# Patient Record
Sex: Female | Born: 2000 | Race: Black or African American | Hispanic: No | Smoking: Never smoker
Health system: Southern US, Community
[De-identification: ages and names within clinical notes are randomized; demographics above are authoritative.]

## PROBLEM LIST (undated history)

## (undated) HISTORY — PX: NO PAST SURGERIES: SHX2092

---
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
## 2019-01-22 ENCOUNTER — Other Ambulatory Visit: Payer: Self-pay

## 2019-01-22 ENCOUNTER — Ambulatory Visit (INDEPENDENT_AMBULATORY_CARE_PROVIDER_SITE_OTHER): Payer: Medicaid Other | Admitting: Certified Nurse Midwife

## 2019-01-22 ENCOUNTER — Ambulatory Visit (INDEPENDENT_AMBULATORY_CARE_PROVIDER_SITE_OTHER): Payer: Medicaid Other

## 2019-01-22 VITALS — BP 100/60 | Temp 96.6°F | Wt 145.0 lb

## 2019-01-22 DIAGNOSIS — Z131 Encounter for screening for diabetes mellitus: Secondary | ICD-10-CM

## 2019-01-22 DIAGNOSIS — Z3403 Encounter for supervision of normal first pregnancy, third trimester: Secondary | ICD-10-CM

## 2019-01-22 DIAGNOSIS — Z362 Encounter for other antenatal screening follow-up: Secondary | ICD-10-CM | POA: Diagnosis not present

## 2019-01-22 DIAGNOSIS — Z3A28 28 weeks gestation of pregnancy: Secondary | ICD-10-CM

## 2019-01-22 DIAGNOSIS — Z130 Encounter for screening for diseases of the blood and blood-forming organs and certain disorders involving the immune mechanism: Secondary | ICD-10-CM

## 2019-01-22 DIAGNOSIS — Z3400 Encounter for supervision of normal first pregnancy, unspecified trimester: Secondary | ICD-10-CM

## 2019-01-22 DIAGNOSIS — A530 Latent syphilis, unspecified as early or late: Secondary | ICD-10-CM

## 2019-01-22 LAB — POCT URINALYSIS DIPSTICK OB: Glucose, UA: NEGATIVE

## 2019-01-22 NOTE — Progress Notes (Signed)
C/o no complaints.  Pls see sticky note regarding tx for syphilis - pt hasn't been seen in 41m.rj

## 2019-01-27 NOTE — Progress Notes (Signed)
ROB at [facility]: Has not been seen in 2 months. Has had 3 Bicillin injections for latent syphilis (LD 12/14/18) at [facility]. Has not been scheduled for 28 week labs. Baby active. Denies problems with contractions, bleeding, or leakage of fluid. Weight gain of 17# in the last 10 weeks, but TWG of 13# FH c/w dates; FHTs WNL ROB and 28 week labs in 1-2 weeks Dalia Heading, CNM

## 2019-02-02 NOTE — L&D Delivery Note (Signed)
  PREOPERATIVE DIAGNOSES: 1. Term pregnancy at 38wks and 6days Gestational Age 20.  Non-Reassuring Fetal Status- Repetitive decelerations  POSTOPERATIVE DIAGNOSES: 1. Term pregnancy at 38 wks and 6days Gestational Age 20. Live, Viable female infant 3. Non-Reassuring Fetal Status- Repetitive decelerations 4. Nuchal cord x 1  OPERATION PERFORMED: Vacuum-Assisted Vaginal Delivery  SURGEON: Adelene Idler, MD  ANESTHESIA: Epidural  ESTIMATED BLOOD LOSS:200cc  FINDINGS: Delivered a female weight pending with Apgars 8&9, three-vessel cord and normal placenta.   COMPLICATIONS: None  SITUATION:  The nonreassuring fetal heart rate tracing was discussed with the patient and her partner, as were the delivery options including a Kiwi vacuum delivery. The pros and cons and the risks of the vacuum delivery were discussed in detail, as were the alternative approaches. The patient and her partner decided to proceed with a Kiwi vacuum delivery.   DESCRIPTION OF THE PROCEDURE:   The baby's head was confirmed to be in the ROA presentation with 100% effacement and +2 station. The bladder was drained. The vacuum was placed and the correct placement in front of the posterior fontanelle was confirmed digitally. With the patient's next contraction, the vacuum was inflated and a gentle pressure was used to assist with maternal pushes to deliver the baby's head. In total there were 2 pulls and 1 pop-off. The vacuum was released between contractions.  After delivery of the head, the vacuum was deflated and removed. There was a nuchal cord. The anterior left shoulder was delivered with gentle downward guidance followed by delivery of the posterior right shoulder with gentle upward guidance. The infant was placed on the maternal chest.  The cord was clamped x2 and cut. The infant was attended to the NICU attendant, vigorous.   Pitocin was added to the patient's IV fluids. The placenta delivered spontaneously, was  intact and had a three-vessel cord. A vaginal inspection revealed bilateral periurethral lacerations. The lacerations were repaired with #2-0  Vicryl suture in a running fashion with local anesthesia.   At the end of the delivery mom and baby were recovering in stable condition.  Sponge, instrument, and needle counts were correct times two.   Adelene Idler MD [facility], [facility] 04/05/19 10:27 PM

## 2019-02-05 ENCOUNTER — Encounter: Payer: Medicaid Other | Admitting: Advanced Practice Midwife

## 2019-02-05 ENCOUNTER — Other Ambulatory Visit: Payer: Medicaid Other

## 2019-02-08 ENCOUNTER — Ambulatory Visit (INDEPENDENT_AMBULATORY_CARE_PROVIDER_SITE_OTHER): Payer: Medicaid Other | Admitting: Obstetrics and Gynecology

## 2019-02-08 ENCOUNTER — Other Ambulatory Visit: Payer: Medicaid Other

## 2019-02-08 ENCOUNTER — Other Ambulatory Visit: Payer: Self-pay

## 2019-02-08 VITALS — BP 100/70 | Wt 149.0 lb

## 2019-02-08 DIAGNOSIS — Z3403 Encounter for supervision of normal first pregnancy, third trimester: Secondary | ICD-10-CM

## 2019-02-08 DIAGNOSIS — A530 Latent syphilis, unspecified as early or late: Secondary | ICD-10-CM

## 2019-02-08 DIAGNOSIS — Z3400 Encounter for supervision of normal first pregnancy, unspecified trimester: Secondary | ICD-10-CM

## 2019-02-08 DIAGNOSIS — Z130 Encounter for screening for diseases of the blood and blood-forming organs and certain disorders involving the immune mechanism: Secondary | ICD-10-CM

## 2019-02-08 DIAGNOSIS — Z131 Encounter for screening for diabetes mellitus: Secondary | ICD-10-CM

## 2019-02-08 DIAGNOSIS — Z3A30 30 weeks gestation of pregnancy: Secondary | ICD-10-CM

## 2019-02-08 NOTE — Patient Instructions (Signed)
I value your feedback and entrusting us with your care. If you get a [facility] patient survey, I would appreciate you taking the time to let us know about your experience today. Thank you!  As of January 11, 2019, your lab results will be released to your MyChart immediately, before I even have a chance to see them. Please give me time to review them and contact you if there are any abnormalities. Thank you for your patience.  

## 2019-02-08 NOTE — Progress Notes (Signed)
ROB BT consent today/Tdap needs to be at [facility] C/o concerned about date discrepancy  Denies lof, no vb, Good FM

## 2019-02-08 NOTE — Progress Notes (Signed)
   Subjective  Fetal Movement? yes Contractions? no Leaking Fluid? no Vaginal Bleeding? no PNVs? Yes  28 wk labs today, completed bicillin tx for syphilis. Having heartburn, not relieved with tums. Try prevacid OTC (handout given). EDD discussed with pt and questions answered.   Objective  LMP 05/05/2018 (LMP Unknown)  General: NAD Pulmonary: no increased work of breathing Abdomen: gravid, non-tender Extremities: no edema Psychiatric: mood appropriate, affect full  Assessment  19 y.o. G1P0000 at [redacted]w[redacted]d by  04/13/2019, by Ultrasound presenting for routine prenatal visit  Plan   Problem List Items Addressed This Visit    None     Labs: 28 wk labs today  RTO: 2 weeks  Kristina Frost B. Kristina Stormes, PA-C [facility],  02/08/2019  9:51 AM

## 2019-02-10 LAB — 28 WEEK RH+PANEL
Basophils Absolute: 0 10*3/uL (ref 0.0–0.2)
Basos: 0 %
EOS (ABSOLUTE): 0 10*3/uL (ref 0.0–0.4)
Eos: 1 %
Gestational Diabetes Screen: 84 mg/dL (ref 65–139)
HIV Screen 4th Generation wRfx: NONREACTIVE
Hematocrit: 32.3 % — ABNORMAL LOW (ref 34.0–46.6)
Hemoglobin: 10.9 g/dL — ABNORMAL LOW (ref 11.1–15.9)
Immature Grans (Abs): 0 10*3/uL (ref 0.0–0.1)
Immature Granulocytes: 0 %
Lymphocytes Absolute: 1.7 10*3/uL (ref 0.7–3.1)
Lymphs: 26 %
MCH: 30.3 pg (ref 26.6–33.0)
MCHC: 33.7 g/dL (ref 31.5–35.7)
MCV: 90 fL (ref 79–97)
Monocytes Absolute: 0.5 10*3/uL (ref 0.1–0.9)
Monocytes: 8 %
Neutrophils Absolute: 4.4 10*3/uL (ref 1.4–7.0)
Neutrophils: 65 %
Platelets: 253 10*3/uL (ref 150–450)
RBC: 3.6 x10E6/uL — ABNORMAL LOW (ref 3.77–5.28)
RDW: 12.9 % (ref 11.7–15.4)
RPR Ser Ql: REACTIVE — AB
WBC: 6.7 10*3/uL (ref 3.4–10.8)

## 2019-02-10 LAB — RPR, QUANT+TP ABS (REFLEX)
Rapid Plasma Reagin, Quant: 1:8 {titer} — ABNORMAL HIGH
T Pallidum Abs: REACTIVE — AB

## 2019-02-22 ENCOUNTER — Ambulatory Visit (INDEPENDENT_AMBULATORY_CARE_PROVIDER_SITE_OTHER): Payer: Medicaid Other | Admitting: Obstetrics and Gynecology

## 2019-02-22 ENCOUNTER — Other Ambulatory Visit: Payer: Self-pay

## 2019-02-22 ENCOUNTER — Encounter: Payer: Self-pay | Admitting: Obstetrics and Gynecology

## 2019-02-22 VITALS — BP 114/70 | Wt 153.0 lb

## 2019-02-22 DIAGNOSIS — Z3A32 32 weeks gestation of pregnancy: Secondary | ICD-10-CM

## 2019-02-22 DIAGNOSIS — A528 Late syphilis, latent: Secondary | ICD-10-CM

## 2019-02-22 DIAGNOSIS — Z3403 Encounter for supervision of normal first pregnancy, third trimester: Secondary | ICD-10-CM

## 2019-02-22 DIAGNOSIS — Z3400 Encounter for supervision of normal first pregnancy, unspecified trimester: Secondary | ICD-10-CM

## 2019-02-22 NOTE — Progress Notes (Signed)
Routine Prenatal Care Visit  Subjective  Kristina Frost is a 19 y.o. G1P0000 at [redacted]w[redacted]d being seen today for ongoing prenatal care.  She is currently monitored for the following issues for this high-risk pregnancy and has Supervision of normal first pregnancy, antepartum and Syphilis, late latent on their problem list.  ----------------------------------------------------------------------------------- Patient reports no complaints.   Contractions: Not present. Vag. Bleeding: None.  Movement: Present. Leaking Fluid denies.  ----------------------------------------------------------------------------------- The following portions of the patient's history were reviewed and updated as appropriate: allergies, current medications, past family history, past medical history, past social history, past surgical history and problem list. Problem list updated.  Objective  Blood pressure 114/70, weight 153 lb (69.4 kg), last menstrual period 05/05/2018. Pregravid weight 132 lb (59.9 kg) Total Weight Gain 21 lb (9.526 kg) Urinalysis: Urine Protein    Urine Glucose    Fetal Status: Fetal Heart Rate (bpm): 145 Fundal Height: 31 cm Movement: Present     General:  Alert, oriented and cooperative. Patient is in no acute distress.  Skin: Skin is warm and dry. No rash noted.   Cardiovascular: Normal heart rate noted  Respiratory: Normal respiratory effort, no problems with respiration noted  Abdomen: Soft, gravid, appropriate for gestational age. Pain/Pressure: Absent     Pelvic:  Cervical exam deferred        Extremities: Normal range of motion.  Edema: None  Mental Status: Normal mood and affect. Normal behavior. Normal judgment and thought content.   Assessment   19 y.o. G1P0000 at [redacted]w[redacted]d by  04/13/2019, by Ultrasound presenting for routine prenatal visit  Plan   Pregnancy#1 Problems (from 05/05/18 to present)    Problem Noted Resolved   Syphilis, late latent 10/05/2018 by Landry Dyke, PA-C  No   Overview Addendum 12/04/2018  1:40 PM by Jossie Ng, RN    09/15/18 RPR 1:64, treponemal test pos @ [facility] Needs weekly tx x 3 [x]  bicillin 2.4 mU given 10/05/18 [x ] bicillin 2.4 mU given 10/12/18 [ ]  bicillin 2.4 mU--missed so needs to repeat series Repeat testing 6 mo (plan to be done at 36-wk prenatal visit at [facility]  Needs weekly tx x 3 [x ]bicillin 2.4 mU--given 11/27/18 [x ]bicillin 2.4 mU - given 12/04/2018 [ ] bicillin 2.4 mU       Supervision of normal first pregnancy, antepartum 08/25/2018 by 13/03/2018, CNM No   Overview Addendum 01/22/2019  3:40 PM by 08/27/2018, CNM    Clinic [facility] Prenatal Labs  Dating By 6w u/s at [facility] Blood type: O/Positive/-- (08/14 1455)   Genetic Screen 1 Screen:    AFP:     Quad:     NIPS: Antibody:Negative (08/14 1455)  Anatomic [facility]  Rubella: 6.06 (08/14 1455) Varicella: @VZVIGG @  GTT Early: NA Third trimester:  RPR: Reactive (08/14 1455)   Rhogam  HBsAg: Negative (08/14 1455)   TDaP vaccine  Flu Shot: HIV: Non Reactive (08/14 1455)   Baby Food Considering breast                    GBS:   Contraception  Pap: <21 yo  CBB     CS/VBAC NA   Support Person Boyfriend Starain              Preterm labor symptoms and general obstetric precautions including but not limited to vaginal bleeding, contractions, leaking of fluid and fetal movement were reviewed in detail with the patient. Please refer to After Visit Summary for other counseling recommendations.   Return  in about 2 weeks (around 03/08/2019) for Routine Prenatal Appointment.  Prentice Docker, MD, [facility], [facility] 02/22/2019 2:25 PM

## 2019-03-09 ENCOUNTER — Encounter: Payer: Medicaid Other | Admitting: Obstetrics and Gynecology

## 2019-03-13 ENCOUNTER — Encounter: Payer: Medicaid Other | Admitting: Obstetrics and Gynecology

## 2019-04-05 ENCOUNTER — Other Ambulatory Visit: Payer: Self-pay

## 2019-04-05 ENCOUNTER — Inpatient Hospital Stay: Payer: Medicaid Other | Admitting: Anesthesiology

## 2019-04-05 ENCOUNTER — Ambulatory Visit (INDEPENDENT_AMBULATORY_CARE_PROVIDER_SITE_OTHER): Payer: Medicaid Other | Admitting: Certified Nurse Midwife

## 2019-04-05 ENCOUNTER — Inpatient Hospital Stay
Admission: EM | Admit: 2019-04-05 | Discharge: 2019-04-07 | DRG: 806 | Disposition: A | Discharge status: Home / Self Care | Payer: Medicaid Other | Attending: Obstetrics and Gynecology | Admitting: Obstetrics and Gynecology

## 2019-04-05 ENCOUNTER — Encounter: Payer: Self-pay | Admitting: Obstetrics and Gynecology

## 2019-04-05 VITALS — BP 142/98 | Wt 164.0 lb

## 2019-04-05 DIAGNOSIS — Z3A38 38 weeks gestation of pregnancy: Secondary | ICD-10-CM

## 2019-04-05 DIAGNOSIS — O163 Unspecified maternal hypertension, third trimester: Secondary | ICD-10-CM | POA: Diagnosis present

## 2019-04-05 DIAGNOSIS — O139 Gestational [pregnancy-induced] hypertension without significant proteinuria, unspecified trimester: Secondary | ICD-10-CM

## 2019-04-05 DIAGNOSIS — Z20822 Contact with and (suspected) exposure to covid-19: Secondary | ICD-10-CM | POA: Diagnosis present

## 2019-04-05 DIAGNOSIS — A528 Late syphilis, latent: Secondary | ICD-10-CM | POA: Diagnosis present

## 2019-04-05 DIAGNOSIS — O9081 Anemia of the puerperium: Secondary | ICD-10-CM | POA: Diagnosis not present

## 2019-04-05 DIAGNOSIS — Z3400 Encounter for supervision of normal first pregnancy, unspecified trimester: Secondary | ICD-10-CM

## 2019-04-05 DIAGNOSIS — R030 Elevated blood-pressure reading, without diagnosis of hypertension: Secondary | ICD-10-CM | POA: Diagnosis present

## 2019-04-05 DIAGNOSIS — O368390 Maternal care for abnormalities of the fetal heart rate or rhythm, unspecified trimester, not applicable or unspecified: Secondary | ICD-10-CM

## 2019-04-05 DIAGNOSIS — O134 Gestational [pregnancy-induced] hypertension without significant proteinuria, complicating childbirth: Secondary | ICD-10-CM | POA: Diagnosis present

## 2019-04-05 DIAGNOSIS — O09893 Supervision of other high risk pregnancies, third trimester: Secondary | ICD-10-CM

## 2019-04-05 DIAGNOSIS — D62 Acute posthemorrhagic anemia: Secondary | ICD-10-CM | POA: Diagnosis not present

## 2019-04-05 HISTORY — DX: Gestational (pregnancy-induced) hypertension without significant proteinuria, unspecified trimester: O13.9

## 2019-04-05 LAB — RAPID HIV SCREEN (HIV 1/2 AB+AG)
HIV 1/2 Antibodies: NONREACTIVE
HIV-1 P24 Antigen - HIV24: NONREACTIVE

## 2019-04-05 LAB — PROTEIN / CREATININE RATIO, URINE
Creatinine, Urine: 279 mg/dL
Protein Creatinine Ratio: 0.17 mg/mg{Cre} — ABNORMAL HIGH (ref 0.00–0.15)
Total Protein, Urine: 47 mg/dL

## 2019-04-05 LAB — RESPIRATORY PANEL BY RT PCR (FLU A&B, COVID)
Influenza A by PCR: NEGATIVE
Influenza B by PCR: NEGATIVE
SARS Coronavirus 2 by RT PCR: NEGATIVE

## 2019-04-05 LAB — COMPREHENSIVE METABOLIC PANEL
ALT: 12 U/L (ref 0–44)
AST: 18 U/L (ref 15–41)
Albumin: 2.9 g/dL — ABNORMAL LOW (ref 3.5–5.0)
Alkaline Phosphatase: 245 U/L — ABNORMAL HIGH (ref 38–126)
Anion gap: 6 (ref 5–15)
BUN: 16 mg/dL (ref 6–20)
CO2: 23 mmol/L (ref 22–32)
Calcium: 8.3 mg/dL — ABNORMAL LOW (ref 8.9–10.3)
Chloride: 106 mmol/L (ref 98–111)
Creatinine, Ser: 0.83 mg/dL (ref 0.44–1.00)
GFR calc Af Amer: >60 mL/min (ref >60)
GFR calc non Af Amer: >60 mL/min (ref >60)
Glucose, Bld: 77 mg/dL (ref 70–99)
Potassium: 3.8 mmol/L (ref 3.5–5.1)
Sodium: 135 mmol/L (ref 135–145)
Total Bilirubin: 0.9 mg/dL (ref 0.3–1.2)
Total Protein: 6.6 g/dL (ref 6.5–8.1)

## 2019-04-05 LAB — POCT URINALYSIS DIPSTICK OB: Glucose, UA: NEGATIVE

## 2019-04-05 LAB — CHLAMYDIA/NGC RT PCR (ARMC ONLY)
Chlamydia Tr: NOT DETECTED
N gonorrhoeae: NOT DETECTED

## 2019-04-05 LAB — CBC
HCT: 32.8 % — ABNORMAL LOW (ref 36.0–46.0)
Hemoglobin: 10.5 g/dL — ABNORMAL LOW (ref 12.0–15.0)
MCH: 27.2 pg (ref 26.0–34.0)
MCHC: 32 g/dL (ref 30.0–36.0)
MCV: 85 fL (ref 80.0–100.0)
Platelets: 302 10*3/uL (ref 150–400)
RBC: 3.86 MIL/uL — ABNORMAL LOW (ref 3.87–5.11)
RDW: 15.1 % (ref 11.5–15.5)
WBC: 8.3 10*3/uL (ref 4.0–10.5)
nRBC: 0 % (ref 0.0–0.2)

## 2019-04-05 LAB — TYPE AND SCREEN
ABO/RH(D): O POS
Antibody Screen: NEGATIVE

## 2019-04-05 LAB — ABO/RH: ABO/RH(D): O POS

## 2019-04-05 LAB — GROUP B STREP BY PCR: Group B strep by PCR: NEGATIVE

## 2019-04-05 MED ORDER — LACTATED RINGERS IV SOLN: 500.0000 mL | INTRAVENOUS | Status: DC | PRN

## 2019-04-05 MED ORDER — OXYTOCIN 40 UNITS IN NORMAL SALINE INFUSION - SIMPLE MED
2.5000 [IU]/h | INTRAVENOUS | Status: DC
  Filled 2019-04-05: qty 1000

## 2019-04-05 MED ORDER — MISOPROSTOL 100 MCG PO TABS
25.0000 ug | ORAL_TABLET | ORAL | Status: DC | PRN
  Administered 2019-04-05: 25 ug via VAGINAL
  Filled 2019-04-05 (×2): qty 1

## 2019-04-05 MED ORDER — TERBUTALINE SULFATE 1 MG/ML IJ SOLN: 0.2500 mg | Freq: Once | INTRAMUSCULAR | Status: DC | PRN

## 2019-04-05 MED ORDER — ACETAMINOPHEN 325 MG PO TABS: 650.0000 mg | ORAL_TABLET | ORAL | Status: DC | PRN

## 2019-04-05 MED ORDER — FENTANYL 2.5 MCG/ML W/ROPIVACAINE 0.15% IN NS 100 ML EPIDURAL (ARMC)
EPIDURAL | Status: AC
  Filled 2019-04-05: qty 100

## 2019-04-05 MED ORDER — ONDANSETRON HCL 4 MG/2ML IJ SOLN: 4.0000 mg | Freq: Four times a day (QID) | INTRAMUSCULAR | Status: DC | PRN

## 2019-04-05 MED ORDER — OXYTOCIN 40 UNITS IN NORMAL SALINE INFUSION - SIMPLE MED: 1.0000 m[IU]/min | INTRAVENOUS | Status: DC

## 2019-04-05 MED ORDER — PHENYLEPHRINE 40 MCG/ML (10ML) SYRINGE FOR IV PUSH (FOR BLOOD PRESSURE SUPPORT)
80.0000 ug | PREFILLED_SYRINGE | INTRAVENOUS | Status: DC | PRN
  Filled 2019-04-05: qty 10

## 2019-04-05 MED ORDER — DIPHENHYDRAMINE HCL 50 MG/ML IJ SOLN: 12.5000 mg | INTRAMUSCULAR | Status: DC | PRN

## 2019-04-05 MED ORDER — LACTATED RINGERS IV SOLN: INTRAVENOUS | Status: DC

## 2019-04-05 MED ORDER — LIDOCAINE-EPINEPHRINE (PF) 1.5 %-1:200000 IJ SOLN
INTRAMUSCULAR | Status: DC | PRN
  Administered 2019-04-05: 3 mL via EPIDURAL

## 2019-04-05 MED ORDER — SODIUM CHLORIDE 0.9 % IV SOLN
INTRAVENOUS | Status: DC | PRN
  Administered 2019-04-05 (×2): 5 mL via EPIDURAL

## 2019-04-05 MED ORDER — EPHEDRINE 5 MG/ML INJ
10.0000 mg | INTRAVENOUS | Status: DC | PRN
  Filled 2019-04-05: qty 2

## 2019-04-05 MED ORDER — SOD CITRATE-CITRIC ACID 500-334 MG/5ML PO SOLN: 30.0000 mL | ORAL | Status: DC | PRN

## 2019-04-05 MED ORDER — OXYTOCIN BOLUS FROM INFUSION
500.0000 mL | Freq: Once | INTRAVENOUS | Status: AC
  Administered 2019-04-05: 500 mL via INTRAVENOUS

## 2019-04-05 MED ORDER — FENTANYL 2.5 MCG/ML W/ROPIVACAINE 0.15% IN NS 100 ML EPIDURAL (ARMC)
12.0000 mL/h | EPIDURAL | Status: DC
  Administered 2019-04-05: 21:00:00 12 mL/h via EPIDURAL

## 2019-04-05 MED ORDER — BUTORPHANOL TARTRATE 2 MG/ML IJ SOLN: 2.0000 mg | INTRAMUSCULAR | Status: DC | PRN

## 2019-04-05 MED ORDER — LACTATED RINGERS IV SOLN: 500.0000 mL | Freq: Once | INTRAVENOUS | Status: DC

## 2019-04-05 MED ORDER — LIDOCAINE HCL (PF) 1 % IJ SOLN
30.0000 mL | INTRAMUSCULAR | Status: AC | PRN
  Administered 2019-04-05: 1 mL via SUBCUTANEOUS

## 2019-04-05 NOTE — Discharge Instructions (Signed)
Discharge Instructions:   Follow-up Appointment: 1 week, please call the office to schedule  If there are any new medications, they have been ordered and will be available for pickup at the listed pharmacy on your way home from the hospital.   Call the office if you have any of the following: headache, visual changes, fever >101.0 F, chills, shortness of breath, breast concerns, excessive vaginal bleeding, incision drainage or problems, leg pain or redness, depression or any other concerns. If you have vaginal discharge with an odor, let your doctor know.   It is normal to bleed for up to 6 weeks. You should not soak through more than 1 pad in 1 hour. If you have a blood clot larger than your fist with continued bleeding, call your doctor.   Activity: Do not lift > 15 lbs for 6 weeks (do not lift anything heavier than your baby). No intercourse, tampons, swimming pools, hot tubs, baths (only showers) for 6 weeks.  No driving for 1-2 weeks. Do not drive while taking narcotic or opioid pain medication.  Continue taking your prenatal vitamin, especially if breastfeeding. Increase calories and fluids (water) while breastfeeding.   Your milk will come in, in the next couple of days (right now it is colostrum). You may have a slight fever when your milk comes in, but it should go away on its own.  If it does not, and rises above 101 F please call the doctor. You will also feel achy and your breasts will be firm. They will also start to leak. If you are breastfeeding, continue as you have been and you can pump/express milk for comfort.   If you have too much milk, your breasts can become engorged, which could lead to mastitis. This is an infection of the milk ducts. It can be very painful and you will need to notify your doctor to obtain a prescription for antibiotics. You can also treat it with a shower or hot/cold compress.   For concerns about your baby, please call your pediatrician.  For  breastfeeding concerns, the lactation consultant can be reached at [phone].   Postpartum blues (feelings of happy one minute and sad another minute) are normal for the first few weeks but if it gets worse let your doctor know.   Congratulations! We enjoyed caring for you and your new bundle of joy!   

## 2019-04-05 NOTE — OB Triage Note (Signed)
Patient was sent over for further evaluation for PIH.

## 2019-04-05 NOTE — Anesthesia Procedure Notes (Signed)
Epidural Patient location during procedure: OB Start time: 04/05/2019 8:48 PM End time: 04/05/2019 8:51 PM  Staffing Anesthesiologist: Piscitello, Cleda Mccreedy, MD Performed: anesthesiologist   Preanesthetic Checklist Completed: patient identified, IV checked, site marked, risks and benefits discussed, surgical consent, monitors and equipment checked, pre-op evaluation and timeout performed  Epidural Patient position: sitting Prep: ChloraPrep Patient monitoring: heart rate, continuous pulse ox and blood pressure Approach: midline Location: L3-L4 Injection technique: LOR saline  Needle:  Needle type: Tuohy  Needle gauge: 17 G Needle length: 9 cm and 9 Needle insertion depth: 6.5 cm Catheter type: closed end flexible Catheter size: 19 Gauge Catheter at skin depth: 13 cm Test dose: negative and 1.5% lidocaine with Epi 1:200 K  Assessment Sensory level: T10 Events: blood not aspirated, injection not painful, no injection resistance, no paresthesia and negative IV test  Additional Notes 1 attempt Pt. Evaluated and documentation done after procedure finished. Patient identified. Risks/Benefits/Options discussed with patient including but not limited to bleeding, infection, nerve damage, paralysis, failed block, incomplete pain control, headache, blood pressure changes, nausea, vomiting, reactions to medication both or allergic, itching and postpartum back pain. Confirmed with bedside nurse the patient's most recent platelet count. Confirmed with patient that they are not currently taking any anticoagulation, have any bleeding history or any family history of bleeding disorders. Patient expressed understanding and wished to proceed. All questions were answered. Sterile technique was used throughout the entire procedure. Please see nursing notes for vital signs. Test dose was given through epidural catheter and negative prior to continuing to dose epidural or start infusion. Warning signs of  high block given to the patient including shortness of breath, tingling/numbness in hands, complete motor block, or any concerning symptoms with instructions to call for help. Patient was given instructions on fall risk and not to get out of bed. All questions and concerns addressed with instructions to call with any issues or inadequate analgesia.   Patient tolerated the insertion well without immediate complications.Reason for block:procedure for pain

## 2019-04-05 NOTE — Progress Notes (Signed)
No concerns.rj 

## 2019-04-05 NOTE — Discharge Summary (Signed)
OB Discharge Summary     Patient Name: Kristina Frost DOB: 02/15/2000 MRN: [id number]  Date of admission: 04/05/2019 Delivering MD: Homero Fellers, MD  Date of Delivery: 04/05/2019  Date of discharge: 04/07/2019  Admitting diagnosis: Elevated blood pressure affecting pregnancy in third trimester, antepartum [O16.3] Gestational hypertension affecting first pregnancy [O13.9]  Late latent syphilis [A52.8] Intrauterine pregnancy: [redacted]w[redacted]d     Secondary diagnosis: Gestational Hypertension     Discharge diagnosis: Term Pregnancy Delivered, Gestational hypertension, late latent syphilis  Hospital course:  Induction of Labor With Vaginal Delivery   19 y.o. yo G1P0000 at [redacted]w[redacted]d was admitted to the hospital 04/05/2019 for induction of labor.  Indication for induction: Gestational hypertension.  Patient had an uncomplicated labor course as follows: Membrane Rupture Time/Date:  ,   Intrapartum Procedures: Episiotomy: None [1]                                         Lacerations:  Periurethral [8]  Patient had delivery of a Viable infant.  Information for the patient's newborn:  Lyndzee, Kliebert [[id number]]  Delivery Method: Vaginal, Vacuum (Extractor)(Filed from Delivery Summary)    04/05/2019  Details of delivery can be found in separate delivery note.  Patient had a routine postpartum course. Patient is discharged home 04/07/19.  She had an RPR titer on 3/4 that was 1:8.  She will possibly need another titer at her six weeks appointment to ensure the titer is not rising. She did receive the three recommended treatment doses within the time-frame recommended. See notes from [facility] for details. She had a negative HIV screen during the pregnancy and a negative rapid HIV screen during her delivery admission. Her BPs had mostly normalized by the time of delivery.  I would normally start the patient on a low-dose of antihypertensive given a blood pressure of 130s/80s.  However, her most recent blood  pressures were in 120s/70s.    Post partum procedures:none  Complications: None  Physical exam on 04/07/2019: Vitals:   04/07/19 0221 04/07/19 0605 04/07/19 0723 04/07/19 1127  BP: 131/79 122/74 (!) 130/93 122/78  Pulse: 61 75 61 76  Resp:   18 18  Temp: 98.2 F (36.8 C) 98.6 F (37 C) 98.7 F (37.1 C) 98.8 F (37.1 C)  TempSrc: Oral Oral Oral Oral  SpO2: 98% 100% 99% 100%  Weight:      Height:       General: alert, cooperative and no distress Lochia: appropriate Uterine Fundus: firm Incision: N/A DVT Evaluation: No evidence of DVT seen on physical exam. No cords or calf tenderness. No significant calf/ankle edema.  Labs: Lab Results  Component Value Date   WBC 11.3 (H) 04/06/2019   HGB 9.9 (L) 04/06/2019   HCT 32.0 (L) 04/06/2019   MCV 86.5 04/06/2019   PLT 285 04/06/2019   CMP Latest Ref Rng & Units 04/05/2019  Glucose 70 - 99 mg/dL 77  BUN 6 - 20 mg/dL 16  Creatinine 0.44 - 1.00 mg/dL 0.83  Sodium 135 - 145 mmol/L 135  Potassium 3.5 - 5.1 mmol/L 3.8  Chloride 98 - 111 mmol/L 106  CO2 22 - 32 mmol/L 23  Calcium 8.9 - 10.3 mg/dL 8.3(L)  Total Protein 6.5 - 8.1 g/dL 6.6  Total Bilirubin 0.3 - 1.2 mg/dL 0.9  Alkaline Phos 38 - 126 U/L 245(H)  AST 15 - 41 U/L 18  ALT 0 -  44 U/L 12    Discharge instruction: per After Visit Summary.  Medications:  Allergies as of 04/07/2019   No Known Allergies     Medication List    STOP taking these medications   Doxylamine-Pyridoxine 10-10 MG Tbec Commonly known as: Diclegis   sucralfate 1 g tablet Commonly known as: Carafate     TAKE these medications   ibuprofen 600 MG tablet Commonly known as: ADVIL Take 1 tablet (600 mg total) by mouth every 6 (six) hours.   prenatal multivitamin Tabs tablet Take 1 tablet by mouth daily at 12 noon.            Discharge Care Instructions  (From admission, onward)         Start     Ordered   04/07/19 0000  Discharge wound care:    Comments: Perform wound care  instructions   04/07/19 1223          Diet: routine diet  Activity: Advance as tolerated. Pelvic rest for 6 weeks.   Outpatient follow up: Follow-up Information    Schuman, Jaquelyn Bitter, MD. Schedule an appointment as soon as possible for a visit in 2 week(s).   Specialty: Obstetrics and Gynecology Contact information: [address] [place] [address] [phone]             Postpartum contraception: Depo Provera to bridge to another form of contraception.  Rhogam Given postpartum: no Rubella vaccine given postpartum: no Varicella vaccine given postpartum: no TDaP given antepartum or postpartum: Yes, PP  Newborn Data: Live born female  Birth Weight:   APGAR: 8, 9  Newborn Delivery   Birth date/time: 04/05/2019 22:03:00 Delivery type: Vaginal, Vacuum (Extractor)      Baby Feeding: breast  Disposition:NICU  SIGNED: Thomasene Mohair, MD, [facility], [facility] 04/07/2019 12:27 PM

## 2019-04-05 NOTE — H&P (Signed)
H&P  Kristina Frost is an 19 y.o. female.  HPI: Patient presents from the office for an evaluation of preeclampsia after elevated blood pressure this morning. She denies headache vision changes right upper quadrant pain.  She has not had abnormal swelling.  She denies any leakage of fluid.  She denies contractions.  She reports normal fetal movement.   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  Pregnancy#1 Problems (from 05/05/18 to present)    Problem Noted Resolved   Syphilis, late latent 10/05/2018 by Lora Havens, PA-C No   Overview Addendum 03/23/2019  4:47 PM by Dalia Heading, CNM    09/15/18 RPR 1:64, treponemal test pos @ [facility] Needs weekly tx x 3 [x]  bicillin 2.4 mU given 10/05/18 [x ] bicillin 2.4 mU given 10/12/18 [ ]  bicillin 2.4 mU-missed dose Repeat testing 6 mo (plan to be done at 36-wk prenatal visit at [place] [facility] weekly tx x 3 [x ]bicillin 2.4 mU--given 11/27/18 [x ]bicillin 2.4 mU - given 12/04/2018 [x ]bicillin 2.4 mU-given 02/12/18  RPR titer 1/7 was 1:8       Supervision of normal first pregnancy, antepartum 08/25/2018 by Rod Can, CNM No   Overview Addendum 03/23/2019  5:09 PM by Dalia Heading, [place] Prenatal Labs  Dating By 6w u/s at [facility] Blood type: O/Positive/-- (08/14 1455)   Genetic Screen NIPS: normal XY Antibody:Negative (08/14 1455)  Anatomic [place] complete Rubella:Immune 6.06 (08/14 1455) Varicella:   GTT Early: NA  Third trimester: 84 RPR: Reactive (08/14 1455)   Rhogam Not needed HBsAg: Negative (08/14 1455)   TDaP vaccine None   Flu Shot: declined HIV: Non Reactive (08/14 1455)   Baby Food Considering breast                    GBS: pending  Contraception  Pap: <21 yo  CBB     CS/VBAC NA   Support Person Boyfriend Starain              History reviewed. No pertinent past medical history.  Past Surgical History:  Procedure Laterality Date  . NO PAST  SURGERIES      History reviewed. No pertinent family history.  Social History:  reports that she has never smoked. She has never used smokeless tobacco. She reports that she does not drink alcohol or use drugs.  Allergies: No Known Allergies  Medications: I have reviewed the patient's current medications.  Results for orders placed or performed during the hospital encounter of 04/05/19 (from the past 48 hour(s))  Protein / creatinine ratio, urine     Status: Abnormal   Collection Time: 04/05/19 11:40 AM  Result Value Ref Range   Creatinine, Urine 279 mg/dL   Total Protein, Urine 47 mg/dL    Comment: NO NORMAL RANGE ESTABLISHED FOR THIS TEST   Protein Creatinine Ratio 0.17 (H) 0.00 - 0.15 mg/mg[Cre]    Comment: Performed at [facility], [address] [place], [place] [address]  Comprehensive metabolic panel     Status: Abnormal   Collection Time: 04/05/19 11:53 AM  Result Value Ref Range   Sodium 135 135 - 145 mmol/L   Potassium 3.8 3.5 - 5.1 mmol/L   Chloride 106 98 - 111 mmol/L   CO2 23 22 - 32 mmol/L   Glucose, Bld 77 70 - 99 mg/dL    Comment: Glucose reference range applies only to samples taken after fasting for at  least 8 hours.   BUN 16 6 - 20 mg/dL   Creatinine, Ser 3.55 0.44 - 1.00 mg/dL   Calcium 8.3 (L) 8.9 - 10.3 mg/dL   Total Protein 6.6 6.5 - 8.1 g/dL   Albumin 2.9 (L) 3.5 - 5.0 g/dL   AST 18 15 - 41 U/L   ALT 12 0 - 44 U/L   Alkaline Phosphatase 245 (H) 38 - 126 U/L   Total Bilirubin 0.9 0.3 - 1.2 mg/dL   GFR calc non Af Amer >60 >60 mL/min   GFR calc Af Amer >60 >60 mL/min   Anion gap 6 5 - 15    Comment: Performed at [facility] [address] [place], [place] [address]  CBC     Status: Abnormal   Collection Time: 04/05/19 11:53 AM  Result Value Ref Range   WBC 8.3 4.0 - 10.5 K/uL   RBC 3.86 (L) 3.87 - 5.11 MIL/uL   Hemoglobin 10.5 (L) 12.0 - 15.0 g/dL   HCT 25.4 (L) 27.0 - 62.3 %   MCV 85.0 80.0 - 100.0 fL   MCH 27.2 26.0 -  34.0 pg   MCHC 32.0 30.0 - 36.0 g/dL   RDW 76.2 83.1 - 51.7 %   Platelets 302 150 - 400 K/uL   nRBC 0.0 0.0 - 0.2 %    Comment: Performed at [facility], [address] [place], [place] [address]  Type and screen [facility]     Status: None   Collection Time: 04/05/19 11:53 AM  Result Value Ref Range   ABO/RH(D) O POS    Antibody Screen NEG    Sample Expiration      04/08/2019,2359 Performed at [facility], [address] [place], [place] [address]   Rapid HIV screen [facility] L&D dept ONLY)     Status: None   Collection Time: 04/05/19 11:53 AM  Result Value Ref Range   HIV-1 P24 Antigen - HIV24 NON REACTIVE NON REACTIVE    Comment: (NOTE) Detection of p24 may be inhibited by biotin in the sample, causing false negative results in acute infection.    HIV 1/2 Antibodies NON REACTIVE NON REACTIVE   Interpretation (HIV Ag Ab)      A non reactive test result means that HIV 1 or HIV 2 antibodies and HIV 1 p24 antigen were not detected in the specimen.    Comment: Performed at [facility], [address], [place], [place] [address]  ABO/Rh     Status: None   Collection Time: 04/05/19  1:08 PM  Result Value Ref Range   ABO/RH(D)      O POS Performed at [facility], [address] [place], [place] [address]     No results found.  Review of Systems  Constitutional: Negative for chills and fever.  HENT: Negative for congestion, hearing loss and sinus pain.   Respiratory: Negative for cough, shortness of breath and wheezing.   Cardiovascular: Negative for chest pain, palpitations and leg swelling.  Gastrointestinal: Negative for abdominal pain, constipation, diarrhea, nausea and vomiting.  Genitourinary: Negative for dysuria, flank pain, frequency, hematuria and urgency.  Musculoskeletal: Negative for back pain.  Skin: Negative for rash.  Neurological: Negative for dizziness and headaches.  Psychiatric/Behavioral:  Negative for suicidal ideas. The patient is not nervous/anxious.    Blood pressure 133/81, pulse 76, temperature 98.7 F (37.1 C), temperature source Oral, resp. rate 11, last menstrual period 05/05/2018.   Physical Exam  Nursing note and vitals reviewed. Constitutional: She is oriented to person, place, and time. She appears well-developed and well-nourished.  HENT:  Head: Normocephalic and atraumatic.  Cardiovascular: Normal rate and regular rhythm.  Respiratory: Effort normal and breath sounds normal.  GI: Soft. Bowel sounds are normal.  Musculoskeletal:        General: Normal range of motion.  Neurological: She is alert and oriented to person, place, and time.  Skin: Skin is warm and dry.  Psychiatric: She has a normal mood and affect. Her behavior is normal. Judgment and thought content normal.   NST: 120 bpm baseline, moderate variability, 15x15 accelerations, no decelerations. Tocometer : none  Assessment/Plan: 19 year old G1, P0 at 44 weeks 6 days gestational age with gestational hypertension  Will admit to labor and delivery for induction of labor.  Start with Cytotec and can advance to Pitocin as needed. GBS and gonorrhea chlamydia collected results are pending.  Regular diet until Pitocin or epidural then clear liquid diet Preeclampsia evaluation was negative. Epidural if desired by patient as needed for pain.  Treated for latent syphilis this pregnancy.  Will need to notify pediatrician after delivery.   Guthrie Lemme R Ariya Bohannon 04/05/2019, 2:32 PM

## 2019-04-05 NOTE — Progress Notes (Signed)
ROB at 38wk6days: Has not been seen for 6 weeks. Baby active. Complains of lower abdominal pains at night when she is trying to get comfortable to sleep. Increased pelvic pressure and uterine tightening. Denies headache, visual changes, SOB, nausea and vomiting. Has heartburn. Wants to breast feed. Given ReadySet Baby info. Sister breast fed her babies and is supportive of patient breast feeding. BP 138/80 and 142/98, trace proteinuria Trace edema in lower extremities FHT WNL FH 32cm A: Elevated blood pressures S<D P: To L&D for PIH evaluation Consider ultrasound for growth if not admitted for IOL Labor precautions FU pending L&D evaluation  Farrel Conners, CNM

## 2019-04-05 NOTE — Anesthesia Preprocedure Evaluation (Signed)
Anesthesia Evaluation  Patient identified by MRN, date of birth, ID band Patient awake    Reviewed: Allergy & Precautions, H&P , NPO status , Patient's Chart, lab work & pertinent test results  History of Anesthesia Complications Negative for: history of anesthetic complications  Airway Mallampati: II  TM Distance: >3 FB Neck ROM: full    Dental  (+) Chipped   Pulmonary neg pulmonary ROS,           Cardiovascular Exercise Tolerance: Good hypertension,      Neuro/Psych    GI/Hepatic negative GI ROS,   Endo/Other    Renal/GU   negative genitourinary   Musculoskeletal   Abdominal   Peds  Hematology negative hematology ROS (+)   Anesthesia Other Findings History reviewed. No pertinent past medical history.  Past Surgical History: No date: NO PAST SURGERIES  BMI    Body Mass Index: 28.15 kg/m      Reproductive/Obstetrics (+) Pregnancy                             Anesthesia Physical Anesthesia Plan  ASA: III  Anesthesia Plan: Epidural   Post-op Pain Management:    Induction:   PONV Risk Score and Plan:   Airway Management Planned: Natural Airway  Additional Equipment:   Intra-op Plan:   Post-operative Plan:   Informed Consent: I have reviewed the patients History and Physical, chart, labs and discussed the procedure including the risks, benefits and alternatives for the proposed anesthesia with the patient or authorized representative who has indicated his/her understanding and acceptance.     Dental Advisory Given  Plan Discussed with: Anesthesiologist  Anesthesia Plan Comments: (Patient reports no bleeding problems and no anticoagulant use.   Patient consented for risks of anesthesia including but not limited to:  - adverse reactions to medications - risk of bleeding, infection, nerve damage and headache - risk of failed epidural - Damage to heart, brain,  lungs or loss of life  Patient voiced understanding.)        Anesthesia Quick Evaluation

## 2019-04-06 LAB — CBC
HCT: 32 % — ABNORMAL LOW (ref 36.0–46.0)
Hemoglobin: 9.9 g/dL — ABNORMAL LOW (ref 12.0–15.0)
MCH: 26.8 pg (ref 26.0–34.0)
MCHC: 30.9 g/dL (ref 30.0–36.0)
MCV: 86.5 fL (ref 80.0–100.0)
Platelets: 285 10*3/uL (ref 150–400)
RBC: 3.7 MIL/uL — ABNORMAL LOW (ref 3.87–5.11)
RDW: 15.2 % (ref 11.5–15.5)
WBC: 11.3 10*3/uL — ABNORMAL HIGH (ref 4.0–10.5)
nRBC: 0 % (ref 0.0–0.2)

## 2019-04-06 LAB — RPR
RPR Ser Ql: REACTIVE — AB
RPR Titer: 1:8 {titer}

## 2019-04-06 MED ORDER — MEDROXYPROGESTERONE ACETATE 150 MG/ML IM SUSP
150.0000 mg | INTRAMUSCULAR | Status: DC | PRN
  Filled 2019-04-06 (×2): qty 1

## 2019-04-06 MED ORDER — ONDANSETRON HCL 4 MG/2ML IJ SOLN: 4.0000 mg | INTRAMUSCULAR | Status: DC | PRN

## 2019-04-06 MED ORDER — COCONUT OIL OIL
1.0000 "application " | TOPICAL_OIL | Status: DC | PRN
  Administered 2019-04-06: 1 via TOPICAL
  Filled 2019-04-06: qty 120

## 2019-04-06 MED ORDER — SIMETHICONE 80 MG PO CHEW: 80.0000 mg | CHEWABLE_TABLET | ORAL | Status: DC | PRN

## 2019-04-06 MED ORDER — DOCUSATE SODIUM 100 MG PO CAPS
100.0000 mg | ORAL_CAPSULE | Freq: Two times a day (BID) | ORAL | Status: DC
  Administered 2019-04-06 – 2019-04-07 (×4): 100 mg via ORAL
  Filled 2019-04-06 (×4): qty 1

## 2019-04-06 MED ORDER — ONDANSETRON HCL 4 MG PO TABS: 4.0000 mg | ORAL_TABLET | ORAL | Status: DC | PRN

## 2019-04-06 MED ORDER — SUCRALFATE 1 G PO TABS
1.0000 g | ORAL_TABLET | Freq: Three times a day (TID) | ORAL | Status: DC
  Administered 2019-04-06 – 2019-04-07 (×5): 1 g via ORAL
  Filled 2019-04-06 (×9): qty 1

## 2019-04-06 MED ORDER — PRENATAL MULTIVITAMIN CH
1.0000 | ORAL_TABLET | Freq: Every day | ORAL | Status: DC
  Administered 2019-04-06 – 2019-04-07 (×2): 1 via ORAL
  Filled 2019-04-06 (×2): qty 1

## 2019-04-06 MED ORDER — TETANUS-DIPHTH-ACELL PERTUSSIS 5-2.5-18.5 LF-MCG/0.5 IM SUSP
0.5000 mL | Freq: Once | INTRAMUSCULAR | Status: AC
  Administered 2019-04-07: 0.5 mL via INTRAMUSCULAR
  Filled 2019-04-06: qty 0.5

## 2019-04-06 MED ORDER — DIBUCAINE (PERIANAL) 1 % EX OINT: 1.0000 "application " | TOPICAL_OINTMENT | CUTANEOUS | Status: DC | PRN

## 2019-04-06 MED ORDER — ACETAMINOPHEN 500 MG PO TABS
1000.0000 mg | ORAL_TABLET | Freq: Four times a day (QID) | ORAL | Status: DC
  Administered 2019-04-06 – 2019-04-07 (×6): 1000 mg via ORAL
  Filled 2019-04-06 (×6): qty 2

## 2019-04-06 MED ORDER — IBUPROFEN 600 MG PO TABS
600.0000 mg | ORAL_TABLET | Freq: Four times a day (QID) | ORAL | Status: DC
  Administered 2019-04-06 – 2019-04-07 (×6): 600 mg via ORAL
  Filled 2019-04-06 (×6): qty 1

## 2019-04-06 MED ORDER — DIPHENHYDRAMINE HCL 25 MG PO CAPS: 25.0000 mg | ORAL_CAPSULE | Freq: Four times a day (QID) | ORAL | Status: DC | PRN

## 2019-04-06 MED ORDER — BENZOCAINE-MENTHOL 20-0.5 % EX AERO
1.0000 "application " | INHALATION_SPRAY | CUTANEOUS | Status: DC | PRN
  Administered 2019-04-06: 1 via TOPICAL
  Filled 2019-04-06: qty 56

## 2019-04-06 MED ORDER — ZOLPIDEM TARTRATE 5 MG PO TABS: 5.0000 mg | ORAL_TABLET | Freq: Every evening | ORAL | Status: DC | PRN

## 2019-04-06 MED ORDER — WITCH HAZEL-GLYCERIN EX PADS: 1.0000 "application " | MEDICATED_PAD | CUTANEOUS | Status: DC | PRN

## 2019-04-06 NOTE — Anesthesia Postprocedure Evaluation (Signed)
Anesthesia Post Note  Patient: Kristina Frost  Procedure(s) Performed: AN AD HOC LABOR EPIDURAL  Patient location during evaluation: Mother Baby Anesthesia Type: Epidural Level of consciousness: awake and alert Pain management: pain level controlled Vital Signs Assessment: post-procedure vital signs reviewed and stable Respiratory status: spontaneous breathing, nonlabored ventilation and respiratory function stable Cardiovascular status: stable Postop Assessment: no headache, no backache and epidural receding Anesthetic complications: no     Last Vitals:  Vitals:   04/06/19 0210 04/06/19 0610  BP: 134/84 128/74  Pulse: 69 84  Resp: 17 16  Temp: 36.7 C 36.9 C  SpO2: 100% 100%    Last Pain:  Vitals:   04/06/19 0610  TempSrc: Oral  PainSc:                  [name]

## 2019-04-06 NOTE — Progress Notes (Signed)
RNCM assessed patient at bedside. Consult was made due to baby having to go to SCN after delivery.  RNCM found patient lying in bed looking at phone, introduced self and CM role. Patient not tearful during visit and is answering questions appropriately. She reports that the father of the baby had to leave temporarily but he will be back, his name is Starain Sweat. Patient reports that she lives alone but her mother lives in the same apartment complex and she along with the father is her main support system. While in room she decided that she wants the baby to be seen at [facility] and was asking questions about signing up for WIC which were answered. Patient reports that she has all necessary items she needs for the baby and that they will bring up the car seat when he is ready to discharge. Patient verbalizes understanding of PPD and SIDS and is aware of signs/symptoms to report. No other needs identified at this time. RNCM will remain available for any further needs.  

## 2019-04-06 NOTE — Progress Notes (Signed)
Subjective:  In SCN visiting baby. She is tearful regarding baby being in SCN. She has been pumping every 3 hours. She is tolerating regular diet. Her pain is controlled with PO medications. She is ambulating and voiding without difficulty. She denies headache, visual changes or epigastric pain.   Objective:  Vital signs in last 24 hours: Temp:  [98 F (36.7 C)-98.7 F (37.1 C)] 98.6 F (37 C) (03/05 0802) Pulse Rate:  [61-92] 77 (03/05 0802) Resp:  [11-18] 18 (03/05 0802) BP: (118-151)/(69-98) 133/94 (03/05 0802) SpO2:  [99 %-100 %] 100 % (03/05 0802) Weight:  [74.4 kg] 74.4 kg (03/04 1517)    General: NAD Pulmonary: no increased work of breathing Abdomen: non-distended, non-tender, fundus firm at level of umbilicus Extremities: no edema, no erythema, no tenderness  Results for orders placed or performed during the hospital encounter of 04/05/19 (from the past 72 hour(s))  Protein / creatinine ratio, urine     Status: Abnormal   Collection Time: 04/05/19 11:40 AM  Result Value Ref Range   Creatinine, Urine 279 mg/dL   Total Protein, Urine 47 mg/dL    Comment: NO NORMAL RANGE ESTABLISHED FOR THIS TEST   Protein Creatinine Ratio 0.17 (H) 0.00 - 0.15 mg/mg[Cre]    Comment: Performed at [facility], [address] [place], [place] [address]  Comprehensive metabolic panel     Status: Abnormal   Collection Time: 04/05/19 11:53 AM  Result Value Ref Range   Sodium 135 135 - 145 mmol/L   Potassium 3.8 3.5 - 5.1 mmol/L   Chloride 106 98 - 111 mmol/L   CO2 23 22 - 32 mmol/L   Glucose, Bld 77 70 - 99 mg/dL    Comment: Glucose reference range applies only to samples taken after fasting for at least 8 hours.   BUN 16 6 - 20 mg/dL   Creatinine, Ser 0.83 0.44 - 1.00 mg/dL   Calcium 8.3 (L) 8.9 - 10.3 mg/dL   Total Protein 6.6 6.5 - 8.1 g/dL   Albumin 2.9 (L) 3.5 - 5.0 g/dL   AST 18 15 - 41 U/L   ALT 12 0 - 44 U/L   Alkaline Phosphatase 245 (H) 38 - 126 U/L   Total  Bilirubin 0.9 0.3 - 1.2 mg/dL   GFR calc non Af Amer >60 >60 mL/min   GFR calc Af Amer >60 >60 mL/min   Anion gap 6 5 - 15    Comment: Performed at [facility], [address] [place], [place] [address]  CBC     Status: Abnormal   Collection Time: 04/05/19 11:53 AM  Result Value Ref Range   WBC 8.3 4.0 - 10.5 K/uL   RBC 3.86 (L) 3.87 - 5.11 MIL/uL   Hemoglobin 10.5 (L) 12.0 - 15.0 g/dL   HCT 32.8 (L) 36.0 - 46.0 %   MCV 85.0 80.0 - 100.0 fL   MCH 27.2 26.0 - 34.0 pg   MCHC 32.0 30.0 - 36.0 g/dL   RDW 15.1 11.5 - 15.5 %   Platelets 302 150 - 400 K/uL   nRBC 0.0 0.0 - 0.2 %    Comment: Performed at [facility], [address] [place], [place] [address]  Type and screen [place]     Status: None   Collection Time: 04/05/19 11:53 AM  Result Value Ref Range   ABO/RH(D) O POS    Antibody Screen NEG    Sample Expiration  04/08/2019,2359 Performed at [facility], [address] [place], [place] [address]   Rapid HIV screen [facility] L&D dept ONLY)     Status: None   Collection Time: 04/05/19 11:53 AM  Result Value Ref Range   HIV-1 P24 Antigen - HIV24 NON REACTIVE NON REACTIVE    Comment: (NOTE) Detection of p24 may be inhibited by biotin in the sample, causing false negative results in acute infection.    HIV 1/2 Antibodies NON REACTIVE NON REACTIVE   Interpretation (HIV Ag Ab)      A non reactive test result means that HIV 1 or HIV 2 antibodies and HIV 1 p24 antigen were not detected in the specimen.    Comment: Performed at [facility], [address] [place], [place] [address]  ABO/Rh     Status: None   Collection Time: 04/05/19  1:08 PM  Result Value Ref Range   ABO/RH(D)      O POS Performed at [facility], [address] [place], [place] [address]   Respiratory Panel by RT PCR (Flu A&B, Covid) - Nasopharyngeal Swab     Status: None   Collection Time: 04/05/19  2:26 PM   Specimen:  Nasopharyngeal Swab  Result Value Ref Range   SARS Coronavirus 2 by RT PCR NEGATIVE NEGATIVE    Comment: (NOTE) SARS-CoV-2 target nucleic acids are NOT DETECTED. The SARS-CoV-2 RNA is generally detectable in upper respiratoy specimens during the acute phase of infection. The lowest concentration of SARS-CoV-2 viral copies this assay can detect is 131 copies/mL. A negative result does not preclude SARS-Cov-2 infection and should not be used as the sole basis for treatment or other patient management decisions. A negative result may occur with  improper specimen collection/handling, submission of specimen other than nasopharyngeal swab, presence of viral mutation(s) within the areas targeted by this assay, and inadequate number of viral copies (<131 copies/mL). A negative result must be combined with clinical observations, patient history, and epidemiological information. The expected result is Negative. Fact Sheet for Patients:  https://www.moore.com/ Fact Sheet for Healthcare Providers:  https://www.young.biz/ This test is not yet ap proved or cleared by the [place] FDA and  has been authorized for detection and/or diagnosis of SARS-CoV-2 by FDA under an Emergency Use Authorization (EUA). This EUA will remain  in effect (meaning this test can be used) for the duration of the COVID-19 declaration under Section 564(b)(1) of the Act, 21 U.S.C. section 360bbb-3(b)(1), unless the authorization is terminated or revoked sooner.    Influenza A by PCR NEGATIVE NEGATIVE   Influenza B by PCR NEGATIVE NEGATIVE    Comment: (NOTE) The Xpert Xpress SARS-CoV-2/FLU/RSV assay is intended as an aid in  the diagnosis of influenza from Nasopharyngeal swab specimens and  should not be used as a sole basis for treatment. Nasal washings and  aspirates are unacceptable for Xpert Xpress SARS-CoV-2/FLU/RSV  testing. Fact Sheet for  Patients: https://www.moore.com/ Fact Sheet for Healthcare Providers: https://www.young.biz/ This test is not yet approved or cleared by the [place] FDA and  has been authorized for detection and/or diagnosis of SARS-CoV-2 by  FDA under an Emergency Use Authorization (EUA). This EUA will remain  in effect (meaning this test can be used) for the duration of the  Covid-19 declaration under Section 564(b)(1) of the Act, 21  U.S.C. section 360bbb-3(b)(1), unless the authorization is  terminated or revoked. Performed at [facility], [address] [place], [place] [address]   Chlamydia/NGC rt PCR (  [facility] only)     Status: None   Collection Time: 04/05/19  2:36 PM   Specimen: Cervical/Vaginal swab  Result Value Ref Range   Specimen source GC/Chlam URINE, RANDOM    Chlamydia Tr NOT DETECTED NOT DETECTED   N gonorrhoeae NOT DETECTED NOT DETECTED    Comment: (NOTE) This CT/NG assay has not been evaluated in patients with a history of  hysterectomy. Performed at [facility], [address] [place], [place] [address]   Group B strep by PCR     Status: None   Collection Time: 04/05/19  2:36 PM  Result Value Ref Range   Group B strep by PCR NEGATIVE NEGATIVE    Comment: (NOTE) Intrapartum testing with Xpert GBS assay should be used as an adjunct to other methods available and not used to replace antepartum testing (at 35-[redacted] weeks gestation). Performed at [facility], [address] [place], [place] [address]   CBC     Status: Abnormal   Collection Time: 04/06/19  6:33 AM  Result Value Ref Range   WBC 11.3 (H) 4.0 - 10.5 K/uL   RBC 3.70 (L) 3.87 - 5.11 MIL/uL   Hemoglobin 9.9 (L) 12.0 - 15.0 g/dL   HCT 44.8 (L) 18.5 - 63.1 %   MCV 86.5 80.0 - 100.0 fL   MCH 26.8 26.0 - 34.0 pg   MCHC 30.9 30.0 - 36.0 g/dL   RDW 49.7 02.6 - 37.8 %   Platelets 285 150 - 400 K/uL   nRBC 0.0 0.0 - 0.2 %    Comment: Performed at  [facility], [address], [place], [place] [address]    Assessment:   19 y.o. G1P0000 postpartum day # 1 Vacuum assisted vaginal delivery, lactating  Plan:    1) Acute blood loss anemia - hemodynamically stable and asymptomatic - po ferrous sulfate  2) Blood Type --/--/O POS Performed at [facility], [address] [place], [place] [address]  [phone]) / Rubella 6.06 (08/14 1455) / Varicella Immune   3) RPR: results pending  4) GHTN: continue to assess for worsening symptoms  5) Social work consult: teen pregnancy, limited PNC, baby in SCN  6) TDAP status needs prior to discharge  7) Feeding plan breast  8)  Education given regarding options for contraception, as well as compatibility with breast feeding if applicable.  Patient plans on Depo-Provera injections for contraception.  9) Disposition: continue current care   Tresea Mall, CNM [facility] 04/06/2019, 8:50 AM

## 2019-04-06 NOTE — Progress Notes (Signed)
   04/06/19 1030  Clinical Encounter Type  Visited With Patient  Visit Type Initial  Referral From Nurse  Consult/Referral To Chaplain  Chaplain visited with patient after stopping at nurse's station. Nurse said patient's baby is in the nursery and mother is a little down. Chaplain asked patient how she was doing and she said fine. Chaplain asked about baby and mother said he was doing well. Chaplain told her that she has a fine baby. Chaplain told patient it she needs to talk to have someone call her and she left.

## 2019-04-06 NOTE — Lactation Note (Signed)
Pt states she has been pumping her breasts every 3 hrs, she has no questions about pump set up, use and cleaning parts, I faxed a WIC referral to [facility] to start process for obtaining an electric loaner pump through [facility] as mom has not been able to enroll in [facility] yet, she states she has called them before she delivered but has not received a call back.  I also gave her the phone no for [facility] to call while she is in the hospital.

## 2019-04-06 NOTE — Lactation Note (Signed)
Mom  States that she talked to [place] [facility]IC and the fob is going to pick up a breast pump at the [facility] office this afternoon.

## 2019-04-07 ENCOUNTER — Ambulatory Visit: Payer: Self-pay

## 2019-04-07 MED ORDER — PRENATAL MULTIVITAMIN CH: 1.0000 | ORAL_TABLET | Freq: Every day | ORAL | Status: DC

## 2019-04-07 MED ORDER — IBUPROFEN 600 MG PO TABS: 600.0000 mg | ORAL_TABLET | Freq: Four times a day (QID) | ORAL | 0 refills | Status: DC

## 2019-04-07 NOTE — Lactation Note (Signed)
This note was copied from a baby's chart. Lactation Consultation Note  Patient Name: Kristina Frost TWSFK'C Date: 04/07/2019 Reason for consult: Initial assessment;Mother's request;Primapara;Early term 37-38.6wks;Infant < 6lbs;Other (Comment)(Mom being discharged today without baby)  Assisted mom with pillow support sitting in chair by SCN bedside.  Hand expressed a few drops of colostrum.  Attempted to latch several times before Sat'urn grasps the breast.  He refused to suck.  Set up SNS at the breast hoping that would entice him to sustain latch and begin sucking without success.  As soon as he took a drop or two of Similac 24 calorie, he would begin gagging and spitting.  Placed #20 nipple shield.  He latched after many attempts to nipple shield.  He took a few weak sucks.  As soon as any formula went into his mouth, he would start gagging and spitting again.  After several attempts, instead of tiring him completely, we stopped the breast feeding session and left him skin to skin with mom.  Encouraged mom to keep pumping for now every 3 hours or 8 or more times in 24 hours and offer the breast whenever she was visiting in [place] and he gave any hunger cues with or without nipple shield.  FOB went by [facility] yesterday and got Symphony DEBP for mom's home use.  Praised mom for her commitment to contine to pump to supply her milk for Sat'urn. Reviewed warmth, breast massage, hand expression, pumping, collection, storage, cleaning, labeling and transporting her milk when separated.  Lactation resource hand out with contact numbers given and encouraged to call with any questions, concerns or assistance.    Maternal Data Formula Feeding for Exclusion: No Has patient been taught Hand Expression?: Yes Does the patient have breastfeeding experience prior to this delivery?: No(Mom is 17 year old Gr1)  Feeding Feeding Type: Formula  LATCH Score Latch: Too sleepy or reluctant, no latch achieved, no sucking  elicited.  Audible Swallowing: None  Type of Nipple: Everted at rest and after stimulation  Comfort (Breast/Nipple): Soft / non-tender  Hold (Positioning): Assistance needed to correctly position infant at breast and maintain latch.  LATCH Score: 5  Interventions Interventions: Breast feeding basics reviewed;Assisted with latch;Skin to skin;Breast massage;Hand express;Reverse pressure;Breast compression;Adjust position;Support pillows;Position options  Lactation Tools Discussed/Used Tools: Pump;2F feeding tube / Syringe;Bottle WIC Program: Yes Pump Review: Setup, frequency, and cleaning;Milk Storage;Other (comment)   Consult Status Consult Status: Follow-up Follow-up type: Call as needed    Louis Meckel 04/07/2019, 4:00 PM

## 2019-04-07 NOTE — Progress Notes (Signed)
Discharge instructions given and prescriptions sent to the pharmacy by the provider. Patient verbalizes understanding of teaching. Wheelchair offered but patient requesting to walk to car for discharge. RN explained safety concerns. Patient still requesting to walk. Patient discharged at 1530.

## 2019-04-08 ENCOUNTER — Ambulatory Visit: Payer: Self-pay

## 2019-04-08 NOTE — Lactation Note (Addendum)
This note was copied from a baby's chart. Lactation Consultation Note  Patient Name: Kristina Frost LSLHT'D Date: 04/08/2019 Reason for consult: Follow-up assessment;Difficult latch;Primapara;NICU baby;Early term 37-38.6wks;Infant < 6lbs;Other (Comment)([name] already taken 41m via bo & was starting tubefeed )  When mom came in to visit [name] this afternoon, he had already gotten 20 ml via bottle.  Mom wanted to breast feed, but he was already full.  Tube feeding for rest of required feeding was being prepared.  While tube feeding was being prepared, [name] started rooting and sucking a little on his hands.  Positioned mom in comfortable position in cradle hold with pillow support.  Tried latching him to the breast.  He refused to latch and was becoming fussy.  #20 nipple shield applied.  After a few attempts, he was able to sustain the latch with nipple shield and began strong rhythmic sucking which mom could feel as strong tugs.  Breast was full.  Demonstrated how to massage breast and stimulate him by gently touching the chin to keep him actively sucking at the breast.  He started slowing down his sucks and occasional swallows after about 5 to 6 minutes, but remained on the breast intermittently sucking up to 10 minutes while tube feeding was going in.  Mom's left breast that she fed on was less full.  Encouraged mom to pump while visiting and DEBP kit placed at [name]'s bedside.  Mom states that she is pumping on the schedule that [name] is feeding which is every 3 hours.  Parents are excited that she is increasing her volume with each pumping.  FOB reports having to leave after this feeding, but could come back for the 9 pm feeding.  Mom reports   Reminded mom to bring in any expressed milk when she comes back.  Praised mom for continuing to pump while she is separated from [employer] breast feeding when she is here.    Maternal Data Formula Feeding for Exclusion: No Has patient  been taught Hand Expression?: Yes Does the patient have breastfeeding experience prior to this delivery?: No(Gr1)  Feeding Feeding Type: Bottle Fed - Formula Nipple Type: Regular  LATCH Score Latch: Repeated attempts needed to sustain latch, nipple held in mouth throughout feeding, stimulation needed to elicit sucking reflex.  Audible Swallowing: A few with stimulation  Type of Nipple: Everted at rest and after stimulation  Comfort (Breast/Nipple): Filling, red/small blisters or bruises, mild/mod discomfort  Hold (Positioning): Assistance needed to correctly position infant at breast and maintain latch.  LATCH Score: 6  Interventions Interventions: Breast feeding basics reviewed;Assisted with latch;Skin to skin;Breast massage;Hand express;Reverse pressure;Breast compression;Adjust position;Support pillows;Position options  Lactation Tools Discussed/Used Tools: Nipple Shields Nipple shield size: 20 WIC Program: Yes Pump Review: Setup, frequency, and cleaning;Milk Storage;Other (comment)   Consult Status Consult Status: Follow-up Follow-up type: Call as needed    WJarold Motto3/08/2019, 2:29 PM

## 2019-04-08 NOTE — Lactation Note (Signed)
This note was copied from a baby's chart. Lactation Consultation Note  Patient Name: Kristina Frost FMBWG'Y Date: 04/08/2019 Reason for consult: Follow-up assessment;Mother's request;Difficult latch;Primapara;Early term 37-38.6wks;Infant < 6lbs;Other (Comment)  Assisted mom with comfortable position with pillow support in modified cradle hold skin to skin on right breast.  Mom can hand express copious amounts of transitional breast milk.  Initially tried without nipple shield without success.  Breasts were full and firm.  [name] would push away and fuss at the breast.  Even with # 20 nipple shield on the breast, he kept pushing the breast out with his tongue.  Once he calmed down and passed some gas, he latched to nipple shield and began strong rhythmic sucking coming off occasionally and having to re latch for approximately 10 minutes of actually sucking before he came off sound asleep.  Tube feeding was started about 5 minutes into the breast feed.  Mom's right breast was softer to touch after breast feeding.  At 19:25 pm mom wanted to pump.  Mom has a WIC pump at home, but she says it is not working but on one side.  Set up Symphony DEBP and mom pumped bilaterally for 15 minutes and got 22 ml from the breast [name] had breast fed on and 30 ml on the side he did not breast feed.  Both breasts felt significantly softer.  Mom is going home to eat and then coming back to breast feed for the 9 pm feeding.   Mom was going to try her pump from [facility] when she got home trying some trouble shooting solutions LC gave her.  If she cannot get that to work, we will loan her a Symphony through the [facility] foundation so she can continue to pump bilaterally.   Maternal Data Formula Feeding for Exclusion: No Has patient been taught Hand Expression?: Yes Does the patient have breastfeeding experience prior to this delivery?: No(Gr1)  Feeding Feeding Type: Breast Fed Nipple Type: Slow - flow  LATCH Score Latch:  Repeated attempts needed to sustain latch, nipple held in mouth throughout feeding, stimulation needed to elicit sucking reflex.  Audible Swallowing: Spontaneous and intermittent  Type of Nipple: Everted at rest and after stimulation  Comfort (Breast/Nipple): Filling, red/small blisters or bruises, mild/mod discomfort  Hold (Positioning): Assistance needed to correctly position infant at breast and maintain latch.  LATCH Score: 7  Interventions Interventions: Assisted with latch;Skin to skin;Breast massage;Hand express;Reverse pressure;Breast compression;Adjust position;Support pillows;Position options;DEBP  Lactation Tools Discussed/Used Tools: Pump;Nipple Shields Nipple shield size: 20 WIC Program: Yes Pump Review: Setup, frequency, and cleaning;Milk Storage;Other (comment)   Consult Status Consult Status: Follow-up Follow-up type: Call as needed    Louis Meckel 04/08/2019, 8:28 PM

## 2019-04-09 ENCOUNTER — Ambulatory Visit: Payer: Self-pay

## 2019-04-09 ENCOUNTER — Encounter: Payer: Medicaid Other | Admitting: Obstetrics and Gynecology

## 2019-04-09 LAB — T.PALLIDUM AB, TOTAL: T Pallidum Abs: REACTIVE — AB

## 2019-04-09 NOTE — Lactation Note (Signed)
This note was copied from a baby's chart. Lactation Consultation Note  Patient Name: Kristina Frost COBTV'M Date: 04/09/2019   Mom came in for one feeding today.  She said she would be back for 3 pm and 6pm feeding but was unable to make it back.  She tried breast feeding when she was here visiting and was unsuccessful to get [name] to sustain the latch for effective milk exchange.  LC did not need to loan her a Symphony pump through [facility] because once she tried the new tubing on the Symphony she got from [facility] it worked fine.  Mom brought in enough breast milk when she came to visit today for [name] to only get breast milk for the rest of the day.  Mom is continuing to pump bilaterally following [name]'s routine feeding patterns with increased volumes that she is proud of.  Encouraged mom to ask for lactation assistance when visiting to assist her with breast feeding.    Maternal Data    Feeding Feeding Type: Breast Milk  LATCH Score                   Interventions    Lactation Tools Discussed/Used     Consult Status      Louis Meckel 04/09/2019, 9:07 PM

## 2019-04-30 ENCOUNTER — Telehealth: Payer: Self-pay | Admitting: Obstetrics and Gynecology

## 2019-04-30 NOTE — Telephone Encounter (Signed)
Patient coming in on 05/18/2019 @ 8:50 with [facility] for 6 wk pp and nexplanon insertion.

## 2019-04-30 NOTE — Telephone Encounter (Signed)
Noted. Will order to arrive by apt date/time. 

## 2019-05-18 ENCOUNTER — Ambulatory Visit: Payer: Medicaid Other | Admitting: Obstetrics and Gynecology

## 2019-05-18 NOTE — Telephone Encounter (Signed)
Nexplanon was reserved for this patient. Patient NO SHOW. Nexplanon placed back in stock.

## 2019-05-21 NOTE — Telephone Encounter (Signed)
Pt coming 4/23 at 1030 with CRS

## 2019-05-22 NOTE — Telephone Encounter (Signed)
Noted. Nexplanon reserved for this patient. 

## 2019-05-25 ENCOUNTER — Ambulatory Visit: Payer: Medicaid Other | Admitting: Obstetrics and Gynecology

## 2019-05-25 NOTE — Telephone Encounter (Signed)
Pt was late. Moved appointment to 5/4 at 310 with [facility]S

## 2019-06-01 ENCOUNTER — Emergency Department
Admission: EM | Admit: 2019-06-01 | Discharge: 2019-06-01 | Disposition: A | Discharge status: Home / Self Care | Payer: Medicaid Other | Attending: Emergency Medicine | Admitting: Emergency Medicine

## 2019-06-01 ENCOUNTER — Other Ambulatory Visit: Payer: Self-pay

## 2019-06-01 DIAGNOSIS — F12929 Cannabis use, unspecified with intoxication, unspecified: Secondary | ICD-10-CM | POA: Insufficient documentation

## 2019-06-01 DIAGNOSIS — R000 Tachycardia, unspecified: Secondary | ICD-10-CM | POA: Diagnosis present

## 2019-06-01 DIAGNOSIS — Z79899 Other long term (current) drug therapy: Secondary | ICD-10-CM | POA: Diagnosis not present

## 2019-06-01 LAB — POCT PREGNANCY, URINE: Preg Test, Ur: NEGATIVE

## 2019-06-01 MED ORDER — ONDANSETRON HCL 4 MG/2ML IJ SOLN: 4.0000 mg | Freq: Once | INTRAMUSCULAR | Status: AC

## 2019-06-01 MED ORDER — SODIUM CHLORIDE 0.9 % IV BOLUS
1000.0000 mL | Freq: Once | INTRAVENOUS | Status: AC
  Administered 2019-06-01: 1000 mL via INTRAVENOUS

## 2019-06-01 MED ORDER — METOCLOPRAMIDE HCL 5 MG/ML IJ SOLN
10.0000 mg | Freq: Once | INTRAMUSCULAR | Status: AC
  Administered 2019-06-01: 10 mg via INTRAVENOUS

## 2019-06-01 MED ORDER — LORAZEPAM 2 MG/ML IJ SOLN
INTRAMUSCULAR | Status: AC
  Filled 2019-06-01: qty 1

## 2019-06-01 MED ORDER — LORAZEPAM 2 MG/ML IJ SOLN
1.0000 mg | Freq: Once | INTRAMUSCULAR | Status: AC
  Administered 2019-06-01: 1 mg via INTRAVENOUS

## 2019-06-01 MED ORDER — ONDANSETRON HCL 4 MG/2ML IJ SOLN
INTRAMUSCULAR | Status: AC
  Administered 2019-06-01: 4 mg via INTRAVENOUS
  Filled 2019-06-01: qty 2

## 2019-06-01 NOTE — ED Notes (Signed)
Pt still sleeping in bed at this time. Rise and fall of chest noted.

## 2019-06-01 NOTE — ED Provider Notes (Addendum)
[facility] Emergency Department Provider Note  Time seen: 8:32 PM  I have reviewed the triage vital signs and the nursing notes.   HISTORY  Chief Complaint Tachycardia   HPI Kristina Frost is a 19 y.o. female with no significant past medical history presents to the emergency department feeling she is going to pass out.  According to the patient she ate marijuana edibles approximately 2 hours ago.  States she feels like her heart is racing, feels like she cannot feel her body and feels like she is going to pass out.  Denies any alcohol use.  Denies any other drugs.  Denies any pain but states she feels like she is going to pass out and that her heart is going to fast.  States she cannot feel her face.   No past medical history on file.  Patient Active Problem List   Diagnosis Date Noted  . Elevated blood pressure affecting pregnancy in third trimester, antepartum 04/05/2019  . Gestational hypertension affecting first pregnancy 04/05/2019  . 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 Supervision of normal first pregnancy, antepartum 08/25/2018    Past Surgical History:  Procedure Laterality Date  . NO PAST SURGERIES      Prior to Admission medications   Medication Sig Start Date End Date Taking? Authorizing Provider  ibuprofen (ADVIL) 600 MG tablet Take 1 tablet (600 mg total) by mouth every 6 (six) hours. 04/07/19   Will Bonnet, MD  Prenatal Vit-Fe Fumarate-FA (PRENATAL MULTIVITAMIN) TABS tablet Take 1 tablet by mouth daily at 12 noon. 04/07/19   Will Bonnet, MD    No Known Allergies  No family history on file.  Social History Social History   Tobacco Use  . Smoking status: Never Smoker  . Smokeless tobacco: Never Used  Substance Use Topics  . Alcohol use: Never  . Drug use: Never    Review of Systems Constitutional: Negative for fever. Cardiovascular: Negative for chest pain.  Feels like her heart is racing. Respiratory: Negative for  shortness of breath. Gastrointestinal: Negative for abdominal pain Musculoskeletal: Negative for musculoskeletal complaints Neurological: Negative for headache.  Numbness in her face and arms. All other ROS negative  ____________________________________________   PHYSICAL EXAM:  VITAL SIGNS: ED Triage Vitals [06/01/19 1940]  Enc Vitals Group     BP (!) 153/74     Pulse Rate (!) 138     Resp 20     Temp 98.8 F (37.1 C)     Temp Source Oral     SpO2 100 %     Weight 146 lb (66.2 kg)     Height 5\' 4"  (1.626 m)     Head Circumference      Peak Flow      Pain Score 0     Pain Loc      Pain Edu?      Excl. in [place]?    Constitutional: Alert and oriented. Well appearing and in no distress. Eyes: Normal exam ENT      Head: Normocephalic and atraumatic.      Mouth/Throat: Mucous membranes are moist. Cardiovascular: Regular rhythm rate around 130 bpm.  No obvious murmur. Respiratory: Normal respiratory effort without tachypnea nor retractions. Breath sounds are clear  Gastrointestinal: Soft and nontender. No distention.   Musculoskeletal: Nontender with normal range of motion in all extremities.  Neurologic:  Normal speech and language. No gross focal neurologic deficits Skin:  Skin is warm, dry and intact.  Psychiatric: Extremely anxious  in appearance.  ____________________________________________    EKG  EKG viewed and interpreted by myself shows sinus tachycardia 139 bpm with a narrow QRS, normal axis, normal intervals with nonspecific but no concerning ST changes.  ____________________________________________    INITIAL IMPRESSION / ASSESSMENT AND PLAN / ED COURSE  Pertinent labs & imaging results that were available during my care of the patient were reviewed by me and considered in my medical decision making (see chart for details).   Patient presents to the emergency department 2 hours after ingesting marijuana edibles.  States it feels like her heart is racing,  states she feels like she is going to pass out.  States she is having trouble feeling her face and arms.  Patient is extremely anxious appearing.  She is able to answer questions appropriately.  We will dose IV fluids, small dose of IV Ativan and continue to closely monitor.  Patient's pregnancy test is negative.  Patient became quite nauseated shortly after receiving medication and vomited.  Patient given Zofran and Reglan in addition to the Ativan.  Patient is now much more calm.  Patient receiving fluids.  We will continue to closely monitor in the emergency department until the patient could safely be discharged home.  Patient is requesting discharge.  Patient is calm cooperative heart rate around 80 bpm.  States she is feeling much better but is feeling somewhat tired after the medication.  Patient's boyfriend is here with her as well.  As the patient appears well is asking to go home I believe she is safe for discharge home at this time.  Kristina Frost was evaluated in Emergency Department on 06/01/2019 for the symptoms described in the history of present illness. She was evaluated in the context of the global COVID-19 pandemic, which necessitated consideration that the patient might be at risk for infection with the SARS-CoV-2 virus that causes COVID-19. Institutional protocols and algorithms that pertain to the evaluation of patients at risk for COVID-19 are in a state of rapid change based on information released by regulatory bodies including the CDC and federal and state organizations. These policies and algorithms were followed during the patient's care in the ED.  ____________________________________________   FINAL CLINICAL IMPRESSION(S) / ED DIAGNOSES  Marijuana intoxication   Minna Antis, MD 06/01/19 8676    Minna Antis, MD 06/01/19 2251

## 2019-06-01 NOTE — ED Notes (Signed)
Significant other called at this time and will come in and sit with pt.

## 2019-06-01 NOTE — ED Triage Notes (Addendum)
Pt took unknown quantitiy of THC edibles approx one hour pta. Pt states she "Feels funny". Pt with noted tachycardia, skin normal color warm and dry. resps unlabored. Pt states she is thirsty, "feel like i'm gonna faint". Pt has difficulty remaining seated, is anxious.

## 2019-06-01 NOTE — ED Notes (Signed)
Blood sent to lab if needed.

## 2019-06-01 NOTE — ED Notes (Signed)
Pt placed on cardiac monitor 

## 2019-06-05 ENCOUNTER — Other Ambulatory Visit: Payer: Self-pay

## 2019-06-05 ENCOUNTER — Ambulatory Visit (INDEPENDENT_AMBULATORY_CARE_PROVIDER_SITE_OTHER): Payer: Medicaid Other | Admitting: Obstetrics and Gynecology

## 2019-06-05 ENCOUNTER — Encounter: Payer: Self-pay | Admitting: Obstetrics and Gynecology

## 2019-06-05 VITALS — BP 100/60 | Ht 64.0 in | Wt 136.0 lb

## 2019-06-05 DIAGNOSIS — Z3046 Encounter for surveillance of implantable subdermal contraceptive: Secondary | ICD-10-CM | POA: Diagnosis not present

## 2019-06-05 DIAGNOSIS — A530 Latent syphilis, unspecified as early or late: Secondary | ICD-10-CM

## 2019-06-05 DIAGNOSIS — Z308 Encounter for other contraceptive management: Secondary | ICD-10-CM | POA: Diagnosis not present

## 2019-06-05 DIAGNOSIS — Z30017 Encounter for initial prescription of implantable subdermal contraceptive: Secondary | ICD-10-CM

## 2019-06-05 DIAGNOSIS — Z3202 Encounter for pregnancy test, result negative: Secondary | ICD-10-CM | POA: Diagnosis not present

## 2019-06-05 LAB — POCT URINE PREGNANCY: Preg Test, Ur: NEGATIVE

## 2019-06-05 NOTE — Progress Notes (Signed)
  OBSTETRICS POSTPARTUM CLINIC PROGRESS NOTE  Subjective:     Kristina Frost is a 19 y.o. G22P1001 female who presents for a postpartum visit. She is 8 weeks postpartum following a Term pregnancy and delivery by VAVD.  I have fully reviewed the prenatal and intrapartum course. Anesthesia: epidural.  Postpartum course has been complicated by uncomplicated.  Baby is feeding by Bottle.  Bleeding: patient has  resumed menses.  Bowel function is normal. Bladder function is normal.  Patient is sexually active. Contraception method desired is Nexplanon.  Postpartum depression screening: negative. Edinburgh 5.  The following portions of the patient's history were reviewed and updated as appropriate: allergies, current medications, past family history, past medical history, past social history, past surgical history and problem list.  Review of Systems Pertinent items are noted in HPI.  Objective:    BP 100/60   Ht 5\' 4"  (1.626 m)   Wt 136 lb (61.7 kg)   LMP 05/06/2019   BMI 23.34 kg/m   General:  alert and no distress   Breasts:  inspection negative, no nipple discharge or bleeding, no masses or nodularity palpable  Lungs: clear to auscultation bilaterally  Heart:  regular rate and rhythm, S1, S2 normal, no murmur, click, rub or gallop  Abdomen: soft, non-tender; bowel sounds normal; no masses,  no organomegaly.   Vulva:  normal  Vagina: normal vagina, no discharge, exudate, lesion, or erythema  Cervix:  no cervical motion tenderness and no lesions  Corpus: normal size, contour, position, consistency, mobility, non-tender  Adnexa:  normal adnexa and no mass, fullness, tenderness  Rectal Exam: Not performed.         Nexplanon Insertion  Patient given informed consent, signed copy in the chart, time out was performed. Pregnancy test was negative. Appropriate time out taken.  Patient's left arm was prepped and draped in the usual sterile fashion.. The ruler used to measure and mark  insertion area.  Pt was prepped with betadine swab and then injected with 5 cc of 2% lidocaine with epinephrine. Nexplanon removed form packaging,  Device confirmed in needle, then inserted full length of needle and withdrawn per handbook instructions.  Pt insertion site covered with steri-strip and a bandage.   Minimal blood loss.  Pt tolerated the procedure well.    Assessment:  Post Partum Care visit 1. Nexplanon insertion Advised to wait 1 week before having unprotected intercourse. Repeat pregnancy test in 2 weeks at home because of recent unprotected intercourse 6 days ago.  - POCT urine pregnancy  2. Postpartum care and examination Well healed vaginal laceration No issues postpartum  3. Latent syphilis Will recheck titer today.  - RPR   Plan:  See orders and Patient Instructions Follow up in: 12 months or as needed.   07/06/2019 MD [facility], [facility] 06/05/2019 4:03 PM

## 2019-06-05 NOTE — Patient Instructions (Signed)
Etonogestrel implant What is this medicine? ETONOGESTREL (et oh noe JES trel) is a contraceptive (birth control) device. It is used to prevent pregnancy. It can be used for up to 3 years. This medicine may be used for other purposes; ask your health care provider or pharmacist if you have questions. COMMON BRAND NAME(S): Implanon, Nexplanon What should I tell my health care provider before I take this medicine? They need to know if you have any of these conditions:  abnormal vaginal bleeding  blood vessel disease or blood clots  breast, cervical, endometrial, ovarian, liver, or uterine cancer  diabetes  gallbladder disease  heart disease or recent heart attack  high blood pressure  high cholesterol or triglycerides  kidney disease  liver disease  migraine headaches  seizures  stroke  tobacco smoker  an unusual or allergic reaction to etonogestrel, anesthetics or antiseptics, other medicines, foods, dyes, or preservatives  pregnant or trying to get pregnant  breast-feeding How should I use this medicine? This device is inserted just under the skin on the inner side of your upper arm by a health care professional. Talk to your pediatrician regarding the use of this medicine in children. Special care may be needed. Overdosage: If you think you have taken too much of this medicine contact a poison control center or emergency room at once. NOTE: This medicine is only for you. Do not share this medicine with others. What if I miss a dose? This does not apply. What may interact with this medicine? Do not take this medicine with any of the following medications:  amprenavir  fosamprenavir This medicine may also interact with the following medications:  acitretin  aprepitant  armodafinil  bexarotene  bosentan  carbamazepine  certain medicines for fungal infections like fluconazole, ketoconazole, itraconazole and voriconazole  certain medicines to treat  hepatitis, HIV or AIDS  cyclosporine  felbamate  griseofulvin  lamotrigine  modafinil  oxcarbazepine  phenobarbital  phenytoin  primidone  rifabutin  rifampin  rifapentine  St. John's wort  topiramate This list may not describe all possible interactions. Give your health care provider a list of all the medicines, herbs, non-prescription drugs, or dietary supplements you use. Also tell them if you smoke, drink alcohol, or use illegal drugs. Some items may interact with your medicine. What should I watch for while using this medicine? This product does not protect you against HIV infection (AIDS) or other sexually transmitted diseases. You should be able to feel the implant by pressing your fingertips over the skin where it was inserted. Contact your doctor if you cannot feel the implant, and use a non-hormonal birth control method (such as condoms) until your doctor confirms that the implant is in place. Contact your doctor if you think that the implant may have broken or become bent while in your arm. You will receive a user card from your health care provider after the implant is inserted. The card is a record of the location of the implant in your upper arm and when it should be removed. Keep this card with your health records. What side effects may I notice from receiving this medicine? Side effects that you should report to your doctor or health care professional as soon as possible:  allergic reactions like skin rash, itching or hives, swelling of the face, lips, or tongue  breast lumps, breast tissue changes, or discharge  breathing problems  changes in emotions or moods  coughing up blood  if you feel that the implant   may have broken or bent while in your arm  high blood pressure  pain, irritation, swelling, or bruising at the insertion site  scar at site of insertion  signs of infection at the insertion site such as fever, and skin redness, pain or  discharge  signs and symptoms of a blood clot such as breathing problems; changes in vision; chest pain; severe, sudden headache; pain, swelling, warmth in the leg; trouble speaking; sudden numbness or weakness of the face, arm or leg  signs and symptoms of liver injury like dark yellow or brown urine; general ill feeling or flu-like symptoms; light-colored stools; loss of appetite; nausea; right upper belly pain; unusually weak or tired; yellowing of the eyes or skin  unusual vaginal bleeding, discharge Side effects that usually do not require medical attention (report to your doctor or health care professional if they continue or are bothersome):  acne  breast pain or tenderness  headache  irregular menstrual bleeding  nausea This list may not describe all possible side effects. Call your doctor for medical advice about side effects. You may report side effects to FDA at 1-800-FDA-1088. Where should I keep my medicine? This drug is given in a hospital or clinic and will not be stored at home. NOTE: This sheet is a summary. It may not cover all possible information. If you have questions about this medicine, talk to your doctor, pharmacist, or health care provider.  2020 Elsevier/Gold Standard (2018-10-31 11:33:04)    Nexplanon Instructions After Insertion   Keep bandage clean and dry for 24 hours   May use ice/Tylenol/Ibuprofen for soreness or pain   If you develop fever, drainage or increased warmth from incision site-contact office immediately   

## 2019-06-07 LAB — RPR: RPR Ser Ql: REACTIVE — AB

## 2019-06-07 LAB — RPR, QUANT+TP ABS (REFLEX)
Rapid Plasma Reagin, Quant: 1:4 {titer} — ABNORMAL HIGH
T Pallidum Abs: REACTIVE — AB

## 2020-01-03 ENCOUNTER — Ambulatory Visit: Payer: Medicaid Other | Admitting: Obstetrics and Gynecology

## 2020-01-24 ENCOUNTER — Ambulatory Visit: Payer: Medicaid Other | Admitting: Obstetrics and Gynecology

## 2020-02-02 NOTE — L&D Delivery Note (Signed)
Vaginal Delivery Note  Spontaneous delivery of live viable female infant from the ROA position through an intact perineum. Delivery of anterior left shoulder with gentle downward guidance followed by delivery of the right posterior shoulder with gentle upward guidance. Body followed spontaneously. Infant placed on maternal chest. Nursery present and helped with neonatal resuscitation and evaluation. Cord clamped and cut after one minute. Cord blood collected. Placenta delivered spontaneously and intact with a 3 vessel cord.  No laceration. Uterus firm and below umbilicus at the end of the delivery.  Mom and baby recovering in stable condition. Sponge and needle counts were correct at the end of the delivery.  APGARS: 1 minute:8 5 minutes: 9 Weight: pending  Adelene Idler MD [facility], [facility] 01/20/21 6:09 PM

## 2020-04-04 ENCOUNTER — Encounter: Payer: Self-pay | Admitting: Obstetrics and Gynecology

## 2020-04-04 ENCOUNTER — Other Ambulatory Visit: Payer: Self-pay

## 2020-04-04 ENCOUNTER — Ambulatory Visit (INDEPENDENT_AMBULATORY_CARE_PROVIDER_SITE_OTHER): Payer: Medicaid Other | Admitting: Obstetrics and Gynecology

## 2020-04-04 VITALS — BP 112/70 | Wt 134.6 lb

## 2020-04-04 DIAGNOSIS — Z113 Encounter for screening for infections with a predominantly sexual mode of transmission: Secondary | ICD-10-CM

## 2020-04-04 DIAGNOSIS — Z30011 Encounter for initial prescription of contraceptive pills: Secondary | ICD-10-CM

## 2020-04-04 DIAGNOSIS — Z3046 Encounter for surveillance of implantable subdermal contraceptive: Secondary | ICD-10-CM

## 2020-04-04 MED ORDER — NORETHIN ACE-ETH ESTRAD-FE 1-20 MG-MCG PO TABS: 1.0000 | ORAL_TABLET | Freq: Every day | ORAL | 11 refills | Status: DC

## 2020-04-04 NOTE — Progress Notes (Signed)
  Nexplanon removal Procedure note - The Nexplanon was noted in the patient's arm and the end was identified. The skin was cleansed with a Betadine solution. A small injection of subcutaneous lidocaine with epinephrine was given over the end of the implant. An incision was made at the end of the implant. The rod was noted in the incision and grasped with a hemostat. It was noted to be intact.  Steri-Strip was placed approximating the incision. Hemostasis was noted.  She would like to transition to OCP. Discussed using condoms or abstinence for 1 week as she transitions to OCP.   Natale Milch ,MD 04/04/2020,10:25 AM

## 2020-04-04 NOTE — Patient Instructions (Signed)
Oral Contraception Information Oral contraceptive pills (OCPs) are medicines taken by mouth to prevent pregnancy. They work by:  Preventing the ovaries from releasing eggs.  Thickening mucus in the lower part of the uterus (cervix). This prevents sperm from entering the uterus.  Thinning the lining of the uterus (endometrium). This prevents a fertilized egg from attaching to the endometrium. OCPs are highly effective when taken exactly as prescribed. However, OCPs do not prevent STIs (sexually transmitted infections). Using condoms while on an OCP can help prevent STIs. What happens before starting OCPs? Before you start taking OCPs:  You may have a physical exam, blood test, and Pap test.  Your health care provider will make sure you are a good candidate for oral contraception. OCPs are not a good option for certain women, such as: ? Women who smoke and are older than age 35. ? Women who have or have had certain conditions, such as:  A history of high blood pressure.  Deep vein thrombosis.  Pulmonary embolism.  Stroke.  Cardiovascular disease.  Peripheral vascular disease. Ask your health care provider about the possible side effects of the OCP you may be prescribed. Be aware that it can take 2-3 months for your body to adjust to changes in hormone levels. Types of oral contraception Birth control pills contain the hormones estrogen and progestin (synthetic progesterone) or progestin only. The combination pill This type of pill contains estrogen and progestin hormones.  Conventional contraception pills come in packs of 21 or 28 pills. ? Some packs with 28-day pills contain estrogen and progestin for the first 21-24 days. Hormone-free tablets, called placebos, are taken for the final 4-7 days. You should have menstrual bleeding during the time you take the placebos. ? In packs with 21 tablets, you take no pills for 7 days. Menstrual bleeding occurs during these days. (Some people  prefer taking a pill for 28 days to help establish a routine).  Extended-interval contraception pills come in packs of 91 pills. The first 84 tablets have both estrogen and progestin. The last 7 pills are placebos. Menstrual bleeding occurs during the placebo days. With this schedule, menstrual bleeding happens once every 3 months.  Continuous contraception pills come in packs of 28 pills. All pills in the pack contain estrogen and progestin. With this schedule, regular menstrual bleeding does not happen, but there may be spotting or irregular bleeding. Progestin-only pills This type of pill is often called the mini-pill and contains the progestin hormone only. It comes in packs of 28 pills. In some packs, the last 4 pills are placebos. The pill must be taken at the same time every day. This is very important to prevent pregnancy. Menstrual bleeding may not be regular or predictable.   What are the advantages? Oral contraception provides reliable and continuous contraception if taken as directed. It may treat or decrease symptoms of:  Menstrual period cramps.  Irregular menstrual cycle or bleeding.  Heavy menstrual flow.  Abnormal uterine bleeding.  Acne, depending on the type of pill.  Polycystic ovarian syndrome (POS).  Endometriosis.  Iron deficiency anemia.  Premenstrual symptoms, including severe irritability, depression, or anxiety. It also may:  Reduce the risk of endometrial and ovarian cancer.  Be used as emergency contraception.  Prevent ectopic pregnancies and infections of the fallopian tubes. What can make OCPs less effective? OCPs may be less effective if:  You forget to take the pill every day. For progestin-only pills, it is especially important to take the pill at the   same time each day. Even taking it 3 hours late can increase the risk of pregnancy.  You have a stomach or intestinal disease that reduces your body's ability to absorb the pill.  You take OCPs  with other medicines that make OCPs less effective, such as antibiotics, certain HIV medicines, and some seizure medicines.  You take expired OCPs.  You forget to restart the pill after 7 days of not taking it. This refers to the packs of 21 pills. What are the side effects and risks? OCPs can sometimes cause side effects, such as:  Headache.  Depression.  Trouble sleeping.  Nausea and vomiting.  Breast tenderness.  Irregular bleeding or spotting during the first several months.  Bloating or fluid retention.  Increase in blood pressure. Combination pills may slightly increase the risk of:  Blood clots.  Heart attack.  Stroke. Follow these instructions at home: Follow instructions from your health care provider about how to start taking your first cycle of OCPs. Depending on when you start the pill, you may need to use a backup form of birth control, such as condoms, during the first week. Make sure you know what steps to take if you forget to take the pill. Summary  Oral contraceptive pills (OCPs) are medicines taken by mouth to prevent pregnancy. They are highly effective when taken exactly as prescribed.  OCPs contain a combination of the hormones estrogen and progestin (synthetic progesterone) or progestin only.  Before you start taking the pill, you may have a physical exam, blood test, and Pap test. Your health care provider will make sure you are a good candidate for oral contraception.  The combination pill may come in a 21-day pack, a 28-day pack, or a 91-day pack. Progestin-only pills come in packs of 28 pills.  OCPs can sometimes cause side effects, such as headache, nausea, breast tenderness, or irregular bleeding. This information is not intended to replace advice given to you by your health care provider. Make sure you discuss any questions you have with your health care provider. Document Revised: 10/19/2019 Document Reviewed: 09/27/2019 Elsevier Patient  Education  2021 Elsevier Inc.  

## 2020-06-17 ENCOUNTER — Other Ambulatory Visit: Payer: Self-pay

## 2020-06-17 ENCOUNTER — Encounter: Payer: Self-pay | Admitting: Obstetrics and Gynecology

## 2020-06-17 ENCOUNTER — Ambulatory Visit (INDEPENDENT_AMBULATORY_CARE_PROVIDER_SITE_OTHER): Payer: Medicaid Other | Admitting: Obstetrics and Gynecology

## 2020-06-17 VITALS — BP 100/70 | Ht 64.0 in | Wt 133.4 lb

## 2020-06-17 DIAGNOSIS — N926 Irregular menstruation, unspecified: Secondary | ICD-10-CM | POA: Diagnosis not present

## 2020-06-17 DIAGNOSIS — Z3490 Encounter for supervision of normal pregnancy, unspecified, unspecified trimester: Secondary | ICD-10-CM

## 2020-06-17 LAB — POCT URINE PREGNANCY: Preg Test, Ur: POSITIVE — AB

## 2020-06-17 NOTE — Patient Instructions (Signed)
Obstetrics: Normal and Problem Pregnancies (7th ed., pp. 102-121). [place], [place]: Elsevier."> Textbook of Family Medicine (9th ed., pp. 365-410). [place], [place]: Else[employer].">  First Trimester of Pregnancy  The first trimester of pregnancy starts on the first day of your last menstrual period until the end of week 12. This is months 1 through 3 of pregnancy. A week after a sperm fertilizes an egg, the egg will implant into the wall of the uterus and begin to develop into a baby. By the end of 12 weeks, all the baby's organs will be formed and the baby will be 2-3 inches in size. Body changes during your first trimester Your body goes through many changes during pregnancy. The changes vary and generally return to normal after your baby is born. Physical changes  You may gain or lose weight.  Your breasts may begin to grow larger and become tender. The tissue that surrounds your nipples (areola) may become darker.  Dark spots or blotches (chloasma or mask of pregnancy) may develop on your face.  You may have changes in your hair. These can include thickening or thinning of your hair or changes in texture. Health changes  You may feel nauseous, and you may vomit.  You may have heartburn.  You may develop headaches.  You may develop constipation.  Your gums may bleed and may be sensitive to brushing and flossing. Other changes  You may tire easily.  You may urinate more often.  Your menstrual periods will stop.  You may have a loss of appetite.  You may develop cravings for certain kinds of food.  You may have changes in your emotions from day to day.  You may have more vivid and strange dreams. Follow these instructions at home: Medicines  Follow your health care provider's instructions regarding medicine use. Specific medicines may be either safe or unsafe to take during pregnancy. Do not take any medicines unless told to by your health care provider.  Take a  prenatal vitamin that contains at least 600 micrograms (mcg) of folic acid. Eating and drinking  Eat a healthy diet that includes fresh fruits and vegetables, whole grains, good sources of protein such as meat, eggs, or tofu, and low-fat dairy products.  Avoid raw meat and unpasteurized juice, milk, and cheese. These carry germs that can harm you and your baby.  If you feel nauseous or you vomit: ? Eat 4 or 5 small meals a day instead of 3 large meals. ? Try eating a few soda crackers. ? Drink liquids between meals instead of during meals.  You may need to take these actions to prevent or treat constipation: ? Drink enough fluid to keep your urine pale yellow. ? Eat foods that are high in fiber, such as beans, whole grains, and fresh fruits and vegetables. ? Limit foods that are high in fat and processed sugars, such as fried or sweet foods. Activity  Exercise only as directed by your health care provider. Most people can continue their usual exercise routine during pregnancy. Try to exercise for 30 minutes at least 5 days a week.  Stop exercising if you develop pain or cramping in the lower abdomen or lower back.  Avoid exercising if it is very hot or humid or if you are at high altitude.  Avoid heavy lifting.  If you choose to, you may have sex unless your health care provider tells you not to. Relieving pain and discomfort  Wear a good support bra to relieve breast   tenderness.  Rest with your legs elevated if you have leg cramps or low back pain.  If you develop bulging veins (varicose veins) in your legs: ? Wear support hose as told by your health care provider. ? Elevate your feet for 15 minutes, 3-4 times a day. ? Limit salt in your diet. Safety  Wear your seat belt at all times when driving or riding in a car.  Talk with your health care provider if someone is verbally or physically abusive to you.  Talk with your health care provider if you are feeling sad or have  thoughts of hurting yourself. Lifestyle  Do not use hot tubs, steam rooms, or saunas.  Do not douche. Do not use tampons or scented sanitary pads.  Do not use herbal remedies, alcohol, illegal drugs, or medicines that are not approved by your health care provider. Chemicals in these products can harm your baby.  Do not use any products that contain nicotine or tobacco, such as cigarettes, e-cigarettes, and chewing tobacco. If you need help quitting, ask your health care provider.  Avoid cat litter boxes and soil used by cats. These carry germs that can cause birth defects in the baby and possibly loss of the unborn baby (fetus) by miscarriage or stillbirth. General instructions  During routine prenatal visits in the first trimester, your health care provider will do a physical exam, perform necessary tests, and ask you how things are going. Keep all follow-up visits. This is important.  Ask for help if you have counseling or nutritional needs during pregnancy. Your health care provider can offer advice or refer you to specialists for help with various needs.  Schedule a dentist appointment. At home, brush your teeth with a soft toothbrush. Floss gently.  Write down your questions. Take them to your prenatal visits. Where to find more information  American Pregnancy Association: americanpregnancy.org  American College of Obstetricians and Gynecologists: acog.org/en/Womens%20Health/Pregnancy  Office on Women's Health: womenshealth.gov/pregnancy Contact a health care provider if you have:  Dizziness.  A fever.  Mild pelvic cramps, pelvic pressure, or nagging pain in the abdominal area.  Nausea, vomiting, or diarrhea that lasts for 24 hours or longer.  A bad-smelling vaginal discharge.  Pain when you urinate.  Known exposure to a contagious illness, such as chickenpox, measles, Zika virus, HIV, or hepatitis. Get help right away if you have:  Spotting or bleeding from your  vagina.  Severe abdominal cramping or pain.  Shortness of breath or chest pain.  Any kind of trauma, such as from a fall or a car crash.  New or increased pain, swelling, or redness in an arm or leg. Summary  The first trimester of pregnancy starts on the first day of your last menstrual period until the end of week 12 (months 1 through 3).  Eating 4 or 5 small meals a day rather than 3 large meals may help to relieve nausea and vomiting.  Do not use any products that contain nicotine or tobacco, such as cigarettes, e-cigarettes, and chewing tobacco. If you need help quitting, ask your health care provider.  Keep all follow-up visits. This is important. This information is not intended to replace advice given to you by your health care provider. Make sure you discuss any questions you have with your health care provider. Document Revised: 06/27/2019 Document Reviewed: 05/03/2019 Elsevier Patient Education  2021 Elsevier Inc.  

## 2020-06-17 NOTE — Progress Notes (Signed)
Patient ID: Kristina Frost, female   DOB: 02/20/2000, 20 y.o.   MRN: [id number]  Reason for Consult: Gynecologic Exam   Referred by No ref. provider found  Subjective:     HPI:  Kristina Frost is a 20 y.o. female.  She presents today for a GYN visit for missed menstrual cycle.  She reports that she had her Nexplanon removed on April 04, 2020.  She reports that since that time she has not had a menstrual cycle.  She took a pregnancy test last week at home which was positive.  She has no complaints and is otherwise feeling well.  History reviewed. No pertinent past medical history. History reviewed. No pertinent family history. Past Surgical History:  Procedure Laterality Date  . NO PAST SURGERIES      Short Social History:  Social History   Tobacco Use  . Smoking status: Never Smoker  . Smokeless tobacco: Never Used  Substance Use Topics  . Alcohol use: Never    No Known Allergies  Current Outpatient Medications  Medication Sig Dispense Refill  . ibuprofen (ADVIL) 600 MG tablet Take 1 tablet (600 mg total) by mouth every 6 (six) hours. (Patient not taking: No sig reported) 30 tablet 0  . norethindrone-ethinyl estradiol (JUNEL FE 1/20) 1-20 MG-MCG tablet Take 1 tablet by mouth daily. (Patient not taking: Reported on 06/17/2020) 28 tablet 11  . Prenatal Vit-Fe Fumarate-FA (PRENATAL MULTIVITAMIN) TABS tablet Take 1 tablet by mouth daily at 12 noon. (Patient not taking: No sig reported)     No current facility-administered medications for this visit.    Review of Systems  Constitutional: Negative for chills, fatigue, fever and unexpected weight change.  HENT: Negative for trouble swallowing.  Eyes: Negative for loss of vision.  Respiratory: Negative for cough, shortness of breath and wheezing.  Cardiovascular: Negative for chest pain, leg swelling, palpitations and syncope.  GI: Negative for abdominal pain, blood in stool, diarrhea, nausea and vomiting.  GU: Negative  for difficulty urinating, dysuria, frequency and hematuria.  Musculoskeletal: Negative for back pain, leg pain and joint pain.  Skin: Negative for rash.  Neurological: Negative for dizziness, headaches, light-headedness, numbness and seizures.  Psychiatric: Negative for behavioral problem, confusion, depressed mood and sleep disturbance.        Objective:  Objective   Vitals:   06/17/20 1010  BP: 100/70  Weight: 133 lb 6.4 oz (60.5 kg)  Height: 5\' 4"  (1.626 m)   Body mass index is 22.9 kg/m.  Physical Exam Vitals and nursing note reviewed. Exam conducted with a chaperone present.  Constitutional:      Appearance: Normal appearance. She is well-developed.  HENT:     Head: Normocephalic and atraumatic.  Eyes:     Extraocular Movements: Extraocular movements intact.     Pupils: Pupils are equal, round, and reactive to light.  Cardiovascular:     Rate and Rhythm: Normal rate and regular rhythm.  Pulmonary:     Effort: Pulmonary effort is normal. No respiratory distress.     Breath sounds: Normal breath sounds.  Abdominal:     General: Abdomen is flat.     Palpations: Abdomen is soft.  Musculoskeletal:        General: No signs of injury.  Skin:    General: Skin is warm and dry.  Neurological:     Mental Status: She is alert and oriented to person, place, and time.  Psychiatric:        Behavior: Behavior normal.  Thought Content: Thought content normal.        Judgment: Judgment normal.     Assessment/Plan:    20 yo with missed period Positive pregnancy test today in office  Bedside Transvaginal US showed a IUP at 6 weeks 1 day with [place] EDD 02/09/2021. Positive FHR 129 bpm. Patient will follow up for new OB appointment. Discussed initiation of prenatal vitamin.  More than 20 minutes were spent face to face with the patient in the room, reviewing the medical record, labs and images, and coordinating care for the patient. The plan of management was discussed in  detail and counseling was provided.    Adelene Idler MD [facility], [facility] 06/17/2020 10:41 AM

## 2020-07-02 ENCOUNTER — Other Ambulatory Visit: Payer: Self-pay

## 2020-07-02 ENCOUNTER — Encounter: Payer: Self-pay | Admitting: Obstetrics

## 2020-07-02 ENCOUNTER — Other Ambulatory Visit (HOSPITAL_COMMUNITY)
Admission: RE | Admit: 2020-07-02 | Discharge: 2020-07-02 | Disposition: A | Discharge status: Home / Self Care | Payer: Medicaid Other | Source: Ambulatory Visit | Attending: Obstetrics | Admitting: Obstetrics

## 2020-07-02 ENCOUNTER — Ambulatory Visit (INDEPENDENT_AMBULATORY_CARE_PROVIDER_SITE_OTHER): Payer: Medicaid Other | Admitting: Obstetrics

## 2020-07-02 VITALS — BP 118/70 | Ht 64.0 in | Wt 130.6 lb

## 2020-07-02 DIAGNOSIS — Z3A08 8 weeks gestation of pregnancy: Secondary | ICD-10-CM | POA: Insufficient documentation

## 2020-07-02 DIAGNOSIS — Z3480 Encounter for supervision of other normal pregnancy, unspecified trimester: Secondary | ICD-10-CM | POA: Insufficient documentation

## 2020-07-02 DIAGNOSIS — O219 Vomiting of pregnancy, unspecified: Secondary | ICD-10-CM

## 2020-07-02 DIAGNOSIS — O0990 Supervision of high risk pregnancy, unspecified, unspecified trimester: Secondary | ICD-10-CM | POA: Insufficient documentation

## 2020-07-02 MED ORDER — ONDANSETRON 4 MG PO TBDP: 4.0000 mg | ORAL_TABLET | Freq: Four times a day (QID) | ORAL | 0 refills | Status: DC | PRN

## 2020-07-02 NOTE — Progress Notes (Signed)
New Obstetric Patient H&P    Chief Complaint: "Desires prenatal care"   History of Present Illness: Patient is a 20 y.o. G2P1001 Not Hispanic or Latino female, LMP unknown presents with amenorrhea and positive home pregnancy test. Based on her  LMP, her EDD is Estimated Date of Delivery: 02/09/21 and her EGA is [redacted]w[redacted]d. Cycles are na due to Nexplanon. . Her last pap smear was NA due to her age . She had a baby last year, and then had a Nexplanon placed.. She requested removal of the implant I March, was given a RX for OCPs, and did not pick them up. She is now here for her new OB visit.   She had a urine pregnancy test which was positive about 3 week(s)  ago.    Since her LMP she claims she has experienced nausea and vomiting.. She denies vaginal bleeding. Her past medical history is noncontributory. Her prior pregnancies are notable for none  Since her LMP, she admits to the use of tobacco products  no She claims she has gained   no pounds since the start of her pregnancy.  There are cats in the home in the home  no  She admits close contact with children on a regular basis  yes  She has had chicken pox in the past no She has had Tuberculosis exposures, symptoms, or previously tested positive for TB   no Current or past history of domestic violence. no  Genetic Screening/Teratology Counseling: (Includes patient, baby's father, or anyone in either family with:)   1. Patient's age >/= 35 at [facility]  no 2. Thalassemia ([place], [place], Mediterranean, or Asian background): MCV<80  no 3. Neural tube defect (meningomyelocele, spina bifida, anencephaly)  no 4. Congenital heart defect  no  5. Down syndrome  no 6. Tay-Sachs (Jewish, [place]  no 7. Canavan's Disease  no 8. Sickle cell disease or trait (African)  no  9. Hemophilia or other blood disorders  no  10. Muscular dystrophy  no  11. Cystic fibrosis  no  12. Huntington's Chorea  no  13. Mental retardation/autism  no 14.  Other inherited genetic or chromosomal disorder  no 15. Maternal metabolic disorder (DM, PKU, etc)  no 16. Patient or FOB with a child with a birth defect not listed above no  16a. Patient or FOB with a birth defect themselves no 17. Recurrent pregnancy loss, or stillbirth  no  18. Any medications since LMP other than prenatal vitamins (include vitamins, supplements, OTC meds, drugs, alcohol)  no 19. Any other genetic/environmental exposure to discuss  no  Infection History:   1. Lives with someone with TB or TB exposed  no  2. Patient or partner has history of genital herpes  no 3. Rash or viral illness since LMP  no 4. History of STI (GC, CT, HPV, syphilis, HIV)  Yes syphillis 5. History of recent travel :  no  Other pertinent information:  no     Review of Systems:10 point review of systems negative unless otherwise noted in HPI  Past Medical History:  No past medical history on file.  Past Surgical History:  Past Surgical History:  Procedure Laterality Date  . NO PAST SURGERIES      Gynecologic History: No LMP recorded (lmp unknown). Patient is pregnant.  Obstetric History: G2P1001  Family History:  No family history on file.  Social History:  Social History   Socioeconomic History  . Marital status: Single  Spouse name: Not on file  . Number of children: Not on file  . Years of education: Not on file  . Highest education level: Not on file  Occupational History  . Not on file  Tobacco Use  . Smoking status: Never Smoker  . Smokeless tobacco: Never Used  Vaping Use  . Vaping Use: Never used  Substance and Sexual Activity  . Alcohol use: Never  . Drug use: Never  . Sexual activity: Yes    Birth control/protection: None  Other Topics Concern  . Not on file  Social History Narrative  . Not on file   Social Determinants of Health   Financial Resource Strain: Not on file  Food Insecurity: Not on file  Transportation Needs: Not on file  Physical  Activity: Not on file  Stress: Not on file  Social Connections: Not on file  Intimate Partner Violence: Not on file    Allergies:  No Known Allergies  Medications: Prior to Admission medications   Medication Sig Start Date End Date Taking? Authorizing Provider  Prenatal Vit-Fe Fumarate-FA (PRENATAL MULTIVITAMIN) TABS tablet Take 1 tablet by mouth daily at 12 noon. 04/07/19  Yes Conard Novak, MD  ibuprofen (ADVIL) 600 MG tablet Take 1 tablet (600 mg total) by mouth every 6 (six) hours. Patient not taking: No sig reported 04/07/19   Conard Novak, MD  norethindrone-ethinyl estradiol (JUNEL FE 1/20) 1-20 MG-MCG tablet Take 1 tablet by mouth daily. Patient not taking: Reported on 06/17/2020 04/04/20   Natale Milch, MD    Physical Exam Vitals: Blood pressure 118/70, height 5\' 4"  (1.626 m), weight 130 lb 9.6 oz (59.2 kg), unknown if currently breastfeeding.  General: NAD HEENT: normocephalic, anicteric Thyroid: no enlargement, no palpable nodules Pulmonary: No increased work of breathing, CTAB Cardiovascular: RRR, distal pulses 2+ Abdomen: NABS, soft, non-tender, non-distended.  Umbilicus without lesions.  No hepatomegaly, splenomegaly or masses palpable. No evidence of hernia  Genitourinary:  External: Normal external female genitalia.  Normal urethral meatus, normal  Bartholin's and Skene's glands.    Vagina: Normal vaginal mucosa, no evidence of prolapse.    Cervix: Grossly normal in appearance, no bleeding  Uterus: retroverted, Non-enlarged, mobile, normal contour.  No CMT  Adnexa: ovaries non-enlarged, no adnexal masses  Rectal: deferred Extremities: no edema, erythema, or tenderness Neurologic: Grossly intact Psychiatric: mood appropriate, affect full   Assessment: 20 y.o. G2P1001 at [redacted]w[redacted]d presenting to initiate prenatal care Closely spaced pregnancies.  Plan: 1) Avoid alcoholic beverages. 2) Patient encouraged not to smoke.  3) Discontinue the use of all  non-medicinal drugs and chemicals.  4) Take prenatal vitamins daily.  5) Nutrition, food safety (fish, cheese advisories, and high nitrite foods) and exercise discussed. 6) Hospital and practice style discussed with cross coverage system.  7) Genetic Screening, such as with 1st Trimester Screening, cell free fetal DNA, AFP testing, and Ultrasound, as well as with amniocentesis and CVS as appropriate, is discussed with patient. At the conclusion of today's visit patient requested genetic testing 8) Patient is asked about travel to areas at risk for the Zika virus, and counseled to avoid travel and exposure to mosquitoes or sexual partners who may have themselves been exposed to the virus. Testing is discussed, and will be ordered as appropriate.   She desires the Maternt test. She had an informal dating scan at her last visit. EDD may need to be adjusted at her next visit.  [redacted]w[redacted]d, CNM  07/02/2020 2:28 PM

## 2020-07-04 LAB — CERVICOVAGINAL ANCILLARY ONLY
Bacterial Vaginitis (gardnerella): POSITIVE — AB
Chlamydia: NEGATIVE
Comment: NEGATIVE
Comment: NEGATIVE
Comment: NEGATIVE
Comment: NORMAL
Neisseria Gonorrhea: NEGATIVE
Trichomonas: NEGATIVE

## 2020-07-08 ENCOUNTER — Other Ambulatory Visit: Payer: Self-pay | Admitting: Obstetrics

## 2020-07-08 ENCOUNTER — Encounter: Payer: Self-pay | Admitting: Obstetrics

## 2020-07-08 DIAGNOSIS — N760 Acute vaginitis: Secondary | ICD-10-CM

## 2020-07-08 DIAGNOSIS — Z3480 Encounter for supervision of other normal pregnancy, unspecified trimester: Secondary | ICD-10-CM

## 2020-07-08 DIAGNOSIS — B9689 Other specified bacterial agents as the cause of diseases classified elsewhere: Secondary | ICD-10-CM

## 2020-07-08 MED ORDER — METRONIDAZOLE 500 MG PO TABS: 500.0000 mg | ORAL_TABLET | Freq: Two times a day (BID) | ORAL | 0 refills | Status: AC

## 2020-07-08 NOTE — Progress Notes (Signed)
BV is picked up at her NOB appointment. She does not have voicemail set up on her phone, so a letter has been dropped into her MyChart. RX for Flagyl sent to the pharmacy. Will need to follow up with her regarding her completing treatment. [name], CNM  07/08/2020 10:46 AM

## 2020-07-25 ENCOUNTER — Other Ambulatory Visit: Payer: Self-pay

## 2020-07-25 ENCOUNTER — Encounter: Payer: Self-pay | Admitting: Obstetrics and Gynecology

## 2020-07-25 ENCOUNTER — Ambulatory Visit (INDEPENDENT_AMBULATORY_CARE_PROVIDER_SITE_OTHER): Payer: Medicaid Other | Admitting: Obstetrics and Gynecology

## 2020-07-25 VITALS — BP 100/70 | Wt 128.0 lb

## 2020-07-25 DIAGNOSIS — Z3481 Encounter for supervision of other normal pregnancy, first trimester: Secondary | ICD-10-CM

## 2020-07-25 DIAGNOSIS — O210 Mild hyperemesis gravidarum: Secondary | ICD-10-CM

## 2020-07-25 DIAGNOSIS — Z3A11 11 weeks gestation of pregnancy: Secondary | ICD-10-CM | POA: Diagnosis not present

## 2020-07-25 DIAGNOSIS — O219 Vomiting of pregnancy, unspecified: Secondary | ICD-10-CM

## 2020-07-25 DIAGNOSIS — Z1379 Encounter for other screening for genetic and chromosomal anomalies: Secondary | ICD-10-CM

## 2020-07-25 LAB — POCT URINALYSIS DIPSTICK OB
Glucose, UA: NEGATIVE
POC,PROTEIN,UA: NEGATIVE

## 2020-07-25 MED ORDER — ONDANSETRON 4 MG PO TBDP: 4.0000 mg | ORAL_TABLET | Freq: Four times a day (QID) | ORAL | 1 refills | Status: DC | PRN

## 2020-07-25 NOTE — Addendum Note (Signed)
Addended by: Donnetta Hail on: 07/25/2020 02:39 PM   Modules accepted: Orders

## 2020-07-25 NOTE — Progress Notes (Signed)
Routine Prenatal Care Visit  Subjective  Kristina Frost is a 20 y.o. G2P1001 at [redacted]w[redacted]d being seen today for ongoing prenatal care.  She is currently monitored for the following issues for this low-risk pregnancy and has Supervision of other normal pregnancy, antepartum on their problem list.  ----------------------------------------------------------------------------------- Patient reports nausea and emesis. She is having a hard time keeping food down.    . Vag. Bleeding: None.   . Leaking Fluid denies.  ----------------------------------------------------------------------------------- The following portions of the patient's history were reviewed and updated as appropriate: allergies, current medications, past family history, past medical history, past social history, past surgical history and problem list. Problem list updated.  Objective  Blood pressure 100/70, weight 128 lb (58.1 kg), unknown if currently breastfeeding. Pregravid weight 133 lb (60.3 kg) Total Weight Gain -5 lb (-2.268 kg) Urinalysis: Urine Protein    Urine Glucose    Fetal Status: Fetal Heart Rate (bpm): 160 ([place])         General:  Alert, oriented and cooperative. Patient is in no acute distress.  Skin: Skin is warm and dry. No rash noted.   Cardiovascular: Normal heart rate noted  Respiratory: Normal respiratory effort, no problems with respiration noted  Abdomen: Soft, gravid, appropriate for gestational age.       Pelvic:  Cervical exam deferred        Extremities: Normal range of motion.  Edema: None  Mental Status: Normal mood and affect. Normal behavior. Normal judgment and thought content.   Bedside transabdominal ultrasound confirms EDD. See NOTES section for details.   Assessment   19 y.o. G2P1001 at [redacted]w[redacted]d by  02/09/2021, by Ultrasound presenting for routine prenatal visit  Plan   pregnancy 1 Problems (from 07/02/20 to present)     Problem Noted Resolved   Supervision of other normal pregnancy,  antepartum 07/02/2020 by [name], CNM No   Overview Signed 07/02/2020  5:31 PM by [name], CNM     Clinic  Prenatal Labs  Dating  Blood type:     Genetic Screen 1 Screen:    AFP:     Quad:     NIPS: Antibody:   Anatomic [place]  Rubella:    GTT Early:               Third trimester:  RPR:     Flu vaccine  HBsAg:     TDaP vaccine                                               Rhogam: HIV:     Baby Food                                               GBS: (For PCN allergy, check sensitivities)  Contraception  Pap:  Circumcision    Pediatrician    Support Person                    Preterm labor symptoms and general obstetric precautions including but not limited to vaginal bleeding, contractions, leaking of fluid and fetal movement were reviewed in detail with the patient. Please refer to After Visit Summary for other counseling recommendations.   - labs today - NIPT today -u/S  today - Rx zofran for nausea  Return in about 4 weeks (around 08/22/2020) for Routine Prenatal Appointment.   Thomasene Mohair, MD, [facility], [facility] 07/25/2020 2:08 PM

## 2020-07-25 NOTE — Progress Notes (Signed)
ULTRASOUND REPORT  Location: [facility] Date of Service: 07/25/2020   Indications:dating, transabdominal Findings:  Singleton intrauterine pregnancy is visualized with a CRL consistent with [redacted]w[redacted]d gestation, giving an (U/S) EDD of 02/06/2021. The (U/S) EDD is consistent with the clinically established EDD of 02/09/2021.  FHR: 160 BPM CRL measurement: 5.42 cm Yolk sac is not visualized. Amnion: not visualized   Right Ovary is normal in appearance. Left Ovary is not visualized. Corpus luteal cyst:  is not visualized Survey of the adnexa demonstrates no adnexal masses. There is no free peritoneal fluid in the cul de sac.  Impression: 1. [redacted]w[redacted]d Viable Singleton Intrauterine pregnancy by U/S. 2. (U/S) EDD is consistent with Clinically established EDD of 02/09/2021.  There is a viable singleton gestation.  Detailed evaluation of the fetal anatomy is precluded by early gestational age.  It must be noted that a normal ultrasound particular at this early gestational age is unable to rule out fetal aneuploidy, risk of first trimester miscarriage, or anatomic birth defects.  I personally performed the ultrasound and interpreted the images.    Thomasene Mohair, MD, [facility], [facility] 07/25/2020 2:10 PM

## 2020-07-29 LAB — RPR+RH+ABO+RUB AB+AB SCR+CB...
Antibody Screen: NEGATIVE
HIV Screen 4th Generation wRfx: NONREACTIVE
Hematocrit: 32.8 % — ABNORMAL LOW (ref 34.0–46.6)
Hemoglobin: 11.4 g/dL (ref 11.1–15.9)
Hepatitis B Surface Ag: NEGATIVE
MCH: 31.3 pg (ref 26.6–33.0)
MCHC: 34.8 g/dL (ref 31.5–35.7)
MCV: 90 fL (ref 79–97)
Platelets: 296 10*3/uL (ref 150–450)
RBC: 3.64 x10E6/uL — ABNORMAL LOW (ref 3.77–5.28)
RDW: 12.1 % (ref 11.7–15.4)
RPR Ser Ql: REACTIVE — AB
Rh Factor: POSITIVE
Rubella Antibodies, IGG: 6.85 index (ref >0.99)
Varicella zoster IgG: 569 index (ref >165)
WBC: 5.9 10*3/uL (ref 3.4–10.8)

## 2020-07-29 LAB — RPR, QUANT+TP ABS (REFLEX)
Rapid Plasma Reagin, Quant: 1:1 {titer} — ABNORMAL HIGH
T Pallidum Abs: REACTIVE — AB

## 2020-07-30 ENCOUNTER — Telehealth: Payer: Self-pay

## 2020-07-30 NOTE — Telephone Encounter (Signed)
Pt called triage wanting gender results from blood work done on the 24th. Pt aware results are not back yet.

## 2020-07-31 LAB — MATERNIT 21 PLUS CORE, BLOOD
Fetal Fraction: 6
Result (T21): NEGATIVE
Trisomy 13 (Patau syndrome): NEGATIVE
Trisomy 18 (Edwards syndrome): NEGATIVE
Trisomy 21 (Down syndrome): NEGATIVE

## 2020-08-01 ENCOUNTER — Encounter: Payer: Self-pay | Admitting: Obstetrics

## 2020-08-01 NOTE — Progress Notes (Signed)
Patient has + RPR result at her new OB visit. Per previous labwork, she was likely treated in the past for syphillis. I have sent her a letter in My Chart. She can come back in for another draw and I will order the Treponin test, or the lab can call [facility] about adding the test to her previous draw. Andrey Campanile , let me know how to proceed- [name], CNM  08/01/2020 5:46 PM

## 2020-08-22 ENCOUNTER — Encounter: Payer: Medicaid Other | Admitting: Obstetrics and Gynecology

## 2020-08-22 ENCOUNTER — Other Ambulatory Visit: Payer: Self-pay

## 2020-08-22 ENCOUNTER — Encounter: Payer: Self-pay | Admitting: Obstetrics & Gynecology

## 2020-08-22 ENCOUNTER — Ambulatory Visit (INDEPENDENT_AMBULATORY_CARE_PROVIDER_SITE_OTHER): Payer: Medicaid Other | Admitting: Obstetrics & Gynecology

## 2020-08-22 VITALS — BP 120/80 | Wt 131.0 lb

## 2020-08-22 DIAGNOSIS — Z3482 Encounter for supervision of other normal pregnancy, second trimester: Secondary | ICD-10-CM

## 2020-08-22 DIAGNOSIS — Z3689 Encounter for other specified antenatal screening: Secondary | ICD-10-CM

## 2020-08-22 DIAGNOSIS — Z3A15 15 weeks gestation of pregnancy: Secondary | ICD-10-CM

## 2020-08-22 NOTE — Patient Instructions (Signed)

## 2020-08-22 NOTE — Progress Notes (Signed)
  Subjective  No nausea Some pain in lower abd and back, intermittent  Objective  BP 120/80   Wt 131 lb (59.4 kg)   LMP  (LMP Unknown)   BMI 22.49 kg/m  General: NAD Pumonary: no increased work of breathing Abdomen: gravid, non-tender Extremities: no edema Psychiatric: mood appropriate, affect full  Assessment  20 y.o. G2P1001 at [redacted]w[redacted]d by  02/09/2021, by Ultrasound presenting for routine prenatal visit  Plan   Problem List Items Addressed This Visit      Other   Supervision of other normal pregnancy, antepartum - Primary  Other Visit Diagnoses    [redacted] weeks gestation of pregnancy       Screening, antenatal, for fetal anatomic survey       Relevant Orders   US OB Comp + 14 Wk    Anat [place] nv Discussed labs incl RPR (low titer 1:1)  pregnancy 1 Problems (from 07/02/20 to present)    Problem Noted Resolved   Supervision of other normal pregnancy, antepartum 07/02/2020 by [name], CNM No   Overview Addendum 08/22/2020  2:35 PM by Nadara Mustard, MD     Clinic [facility] Prenatal Labs  Dating US/LMP Blood type: O/Positive/-- (06/24 1412) O+  Genetic Screen NIPS:nmlXY Antibody:Negative (06/24 1412)negative  Anatomic [place]  Rubella: 6.85 (06/24 1412)immune  GTT Third trimester:  RPR: Reactive (06/24 1412) reactive, Trep reactive  Flu vaccine  HBsAg: Negative (06/24 1412) neg  TDaP vaccine           Rhogam:n/a HIV: Non Reactive (06/24 1412) NR  Baby Food                                               GBS: (For PCN allergy, check sensitivities)  Contraception  Pap:  Circumcision    Pediatrician    Support Person                  Annamarie Major, MD, [facility], [facility] 08/22/2020  2:54 PM

## 2020-09-19 ENCOUNTER — Ambulatory Visit
Admission: RE | Admit: 2020-09-19 | Discharge: 2020-09-19 | Disposition: A | Discharge status: Home / Self Care | Payer: Medicaid Other | Source: Ambulatory Visit | Attending: Obstetrics & Gynecology | Admitting: Obstetrics & Gynecology

## 2020-09-19 ENCOUNTER — Other Ambulatory Visit: Payer: Self-pay

## 2020-09-19 DIAGNOSIS — Z3689 Encounter for other specified antenatal screening: Secondary | ICD-10-CM | POA: Diagnosis not present

## 2020-09-22 ENCOUNTER — Other Ambulatory Visit: Payer: Self-pay

## 2020-09-22 ENCOUNTER — Ambulatory Visit (INDEPENDENT_AMBULATORY_CARE_PROVIDER_SITE_OTHER): Payer: Medicaid Other | Admitting: Obstetrics and Gynecology

## 2020-09-22 ENCOUNTER — Encounter: Payer: Self-pay | Admitting: Obstetrics and Gynecology

## 2020-09-22 VITALS — BP 110/70 | Ht 64.0 in | Wt 140.4 lb

## 2020-09-22 DIAGNOSIS — Z3482 Encounter for supervision of other normal pregnancy, second trimester: Secondary | ICD-10-CM

## 2020-09-22 DIAGNOSIS — Z3A20 20 weeks gestation of pregnancy: Secondary | ICD-10-CM

## 2020-09-22 DIAGNOSIS — Z3480 Encounter for supervision of other normal pregnancy, unspecified trimester: Secondary | ICD-10-CM

## 2020-09-22 NOTE — Progress Notes (Signed)
    Routine Prenatal Care Visit  Subjective  Kristina Frost is a 20 y.o. G2P1001 at [redacted]w[redacted]d being seen today for ongoing prenatal care.  She is currently monitored for the following issues for this low-risk pregnancy and has Supervision of other normal pregnancy, antepartum on their problem list.  ----------------------------------------------------------------------------------- Patient reports no complaints.    . Vag. Bleeding: None.   . Denies leaking of fluid.  ----------------------------------------------------------------------------------- The following portions of the patient's history were reviewed and updated as appropriate: allergies, current medications, past family history, past medical history, past social history, past surgical history and problem list. Problem list updated.   Objective  Blood pressure 110/70, height 5\' 4"  (1.626 m), weight 140 lb 6.4 oz (63.7 kg), unknown if currently breastfeeding. Pregravid weight 133 lb (60.3 kg) Total Weight Gain 7 lb 6.4 oz (3.357 kg) Urinalysis:      Fetal Status:           General:  Alert, oriented and cooperative. Patient is in no acute distress.  Skin: Skin is warm and dry. No rash noted.   Cardiovascular: Normal heart rate noted  Respiratory: Normal respiratory effort, no problems with respiration noted  Abdomen: Soft, gravid, appropriate for gestational age.       Pelvic:  Cervical exam deferred        Extremities: Normal range of motion.     Mental Status: Normal mood and affect. Normal behavior. Normal judgment and thought content.     Assessment   19 y.o. G2P1001 at [redacted]w[redacted]d by  02/09/2021, by Ultrasound presenting for routine prenatal visit  Plan   pregnancy 1 Problems (from 07/02/20 to present)     Problem Noted Resolved   Supervision of other normal pregnancy, antepartum 07/02/2020 by 09/01/2020, CNM No   Overview Addendum 09/22/2020  2:59 PM by 09/24/2020, MD      Nursing Staff Provider  Office  Location  [facility] Dating   6 wk Natale Milch  Language  English Anatomy US   incomplete  Flu Vaccine   Genetic Screen  NIPS: normal XY  TDaP vaccine       Covid unvaccinated   LAB RESULTS   Rhogam  Not needed Blood Type O/Positive/-- (06/24 1412)   Feeding Plan Pumping Antibody Negative (06/24 1412)  Contraception Nexplanon Rubella 6.85 (06/24 1412)  Circumcision  RPR Reactive (06/24 1412)   Pediatrician   HBsAg Negative (06/24 1412)   Support Person FOB: Starain HIV Non Reactive (06/24 1412)  Prenatal Classes  Varicella   immune    GBS  (For PCN allergy, check sensitivities)   BTL Consent     VBAC Consent  Pap  under 21    Hgb Electro      CF      SMA          Closely spaced pregnancies  History of syphilis- treated February 2021            Gestational age appropriate obstetric precautions including but not limited to vaginal bleeding, contractions, leaking of fluid and fetal movement were reviewed in detail with the patient.    Return in about 4 weeks (around 10/20/2020) for ROB in person.  10/22/2020 MD [facility], [facility] 09/22/2020, 2:48 PM

## 2020-09-22 NOTE — Patient Instructions (Addendum)
Prenatal Classes: [phone]  Second Trimester of Pregnancy  The second trimester of pregnancy is from week 13 through week 27. This is months 4 through 6 of pregnancy. The second trimester is often a time when you feel your best. Your body has adjusted to being pregnant, and you begin to feelbetter physically. During the second trimester: Morning sickness has lessened or stopped completely. You may have more energy. You may have an increase in appetite. The second trimester is also a time when the unborn baby (fetus) is growing rapidly. At the end of the sixth month, the fetus may be up to 12 inches long and weigh about 1 pounds. You will likely begin to feel the baby move (quickening) between 16 and 20 weeks of pregnancy. Body changes during your second trimester Your body continues to go through many changes during your second trimester.The changes vary and generally return to normal after the baby is born. Physical changes Your weight will continue to increase. You will notice your lower abdomen bulging out. You may begin to get stretch marks on your hips, abdomen, and breasts. Your breasts will continue to grow and to become tender. Dark spots or blotches (chloasma or mask of pregnancy) may develop on your face. A dark line from your belly button to the pubic area (linea nigra) may appear. You may have changes in your hair. These can include thickening of your hair, rapid growth, and changes in texture. Some people also have hair loss during or after pregnancy, or hair that feels dry or thin. Health changes You may develop headaches. You may have heartburn. You may develop constipation. You may develop hemorrhoids or swollen, bulging veins (varicose veins). Your gums may bleed and may be sensitive to brushing and flossing. You may urinate more often because the fetus is pressing on your bladder. You may have back pain. This is caused by: Weight gain. Pregnancy hormones that are  relaxing the joints in your pelvis. A shift in weight and the muscles that support your balance. Follow these instructions at home: Medicines Follow your health care provider's instructions regarding medicine use. Specific medicines may be either safe or unsafe to take during pregnancy. Do not take any medicines unless approved by your health care provider. Take a prenatal vitamin that contains at least 600 micrograms (mcg) of folic acid. Eating and drinking Eat a healthy diet that includes fresh fruits and vegetables, whole grains, good sources of protein such as meat, eggs, or tofu, and low-fat dairy products. Avoid raw meat and unpasteurized juice, milk, and cheese. These carry germs that can harm you and your baby. You may need to take these actions to prevent or treat constipation: Drink enough fluid to keep your urine pale yellow. Eat foods that are high in fiber, such as beans, whole grains, and fresh fruits and vegetables. Limit foods that are high in fat and processed sugars, such as fried or sweet foods. Activity Exercise only as directed by your health care provider. Most people can continue their usual exercise routine during pregnancy. Try to exercise for 30 minutes at least 5 days a week. Stop exercising if you develop contractions in your uterus. Stop exercising if you develop pain or cramping in the lower abdomen or lower back. Avoid exercising if it is very hot or humid or if you are at a high altitude. Avoid heavy lifting. If you choose to, you may have sex unless your health care provider tells you not to. Relieving pain and discomfort Wear  a supportive bra to prevent discomfort from breast tenderness. Take warm sitz baths to soothe any pain or discomfort caused by hemorrhoids. Use hemorrhoid cream if your health care provider approves. Rest with your legs raised (elevated) if you have leg cramps or low back pain. If you develop varicose veins: Wear support hose as told by  your health care provider. Elevate your feet for 15 minutes, 3-4 times a day. Limit salt in your diet. Safety Wear your seat belt at all times when driving or riding in a car. Talk with your health care provider if someone is verbally or physically abusive to you. Lifestyle Do not use hot tubs, steam rooms, or saunas. Do not douche. Do not use tampons or scented sanitary pads. Avoid cat litter boxes and soil used by cats. These carry germs that can cause birth defects in the baby and possibly loss of the fetus by miscarriage or stillbirth. Do not use herbal remedies, alcohol, illegal drugs, or medicines that are not approved by your health care provider. Chemicals in these products can harm your baby. Do not use any products that contain nicotine or tobacco, such as cigarettes, e-cigarettes, and chewing tobacco. If you need help quitting, ask your health care provider. General instructions During a routine prenatal visit, your health care provider will do a physical exam and other tests. He or she will also discuss your overall health. Keep all follow-up visits. This is important. Ask your health care provider for a referral to a local prenatal education class. Ask for help if you have counseling or nutritional needs during pregnancy. Your health care provider can offer advice or refer you to specialists for help with various needs. Where to find more information American Pregnancy Association: americanpregnancy.org [employer]: https://www.todd-brady.net/ Office on [employer] Health: [employer] Contact a health care provider if you have: A headache that does not go away when you take medicine. Vision changes or you see spots in front of your eyes. Mild pelvic cramps, pelvic pressure, or nagging pain in the abdominal area. Persistent nausea, vomiting, or diarrhea. A bad-smelling vaginal discharge or foul-smelling urine. Pain  when you urinate. Sudden or extreme swelling of your face, hands, ankles, feet, or legs. A fever. Get help right away if you: Have fluid leaking from your vagina. Have spotting or bleeding from your vagina. Have severe abdominal cramping or pain. Have difficulty breathing. Have chest pain. Have fainting spells. Have not felt your baby move for the time period told by your health care provider. Have new or increased pain, swelling, or redness in an arm or leg. Summary The second trimester of pregnancy is from week 13 through week 27 (months 4 through 6). Do not use herbal remedies, alcohol, illegal drugs, or medicines that are not approved by your health care provider. Chemicals in these products can harm your baby. Exercise only as directed by your health care provider. Most people can continue their usual exercise routine during pregnancy. Keep all follow-up visits. This is important. This information is not intended to replace advice given to you by your health care provider. Make sure you discuss any questions you have with your healthcare provider. Document Revised: 06/27/2019 Document Reviewed: 05/03/2019 Elsevier Patient Education  2022 [employer].

## 2020-10-17 ENCOUNTER — Other Ambulatory Visit: Payer: Self-pay

## 2020-10-17 ENCOUNTER — Ambulatory Visit
Admission: RE | Admit: 2020-10-17 | Discharge: 2020-10-17 | Disposition: A | Discharge status: Home / Self Care | Payer: Medicaid Other | Source: Ambulatory Visit | Attending: Obstetrics and Gynecology | Admitting: Obstetrics and Gynecology

## 2020-10-17 DIAGNOSIS — Z3482 Encounter for supervision of other normal pregnancy, second trimester: Secondary | ICD-10-CM | POA: Insufficient documentation

## 2020-10-20 ENCOUNTER — Encounter: Payer: Medicaid Other | Admitting: Advanced Practice Midwife

## 2020-11-03 ENCOUNTER — Other Ambulatory Visit: Payer: Self-pay

## 2020-11-03 ENCOUNTER — Ambulatory Visit (INDEPENDENT_AMBULATORY_CARE_PROVIDER_SITE_OTHER): Payer: Medicaid Other | Admitting: Obstetrics

## 2020-11-03 VITALS — BP 116/64 | Wt 146.0 lb

## 2020-11-03 DIAGNOSIS — Z3A26 26 weeks gestation of pregnancy: Secondary | ICD-10-CM

## 2020-11-03 DIAGNOSIS — Z3480 Encounter for supervision of other normal pregnancy, unspecified trimester: Secondary | ICD-10-CM

## 2020-11-03 DIAGNOSIS — Z3482 Encounter for supervision of other normal pregnancy, second trimester: Secondary | ICD-10-CM

## 2020-11-03 NOTE — Progress Notes (Signed)
  Routine Prenatal Care Visit  Subjective  Kristina Frost is a 20 y.o. G2P1001 at [redacted]w[redacted]d being seen today for ongoing prenatal care.  She is currently monitored for the following issues for this low-risk pregnancy and has Supervision of other normal pregnancy, antepartum on their problem list.  ----------------------------------------------------------------------------------- Patient reports no complaints.   Contractions: Not present. Vag. Bleeding: None.  Movement: Present. Leaking Fluid denies.  ----------------------------------------------------------------------------------- The following portions of the patient's history were reviewed and updated as appropriate: allergies, current medications, past family history, past medical history, past social history, past surgical history and problem list. Problem list updated.  Objective  Blood pressure 116/64, weight 146 lb (66.2 kg), unknown if currently breastfeeding. Pregravid weight 133 lb (60.3 kg) Total Weight Gain 13 lb (5.897 kg) Urinalysis: Urine Protein    Urine Glucose    Fetal Status:     Movement: Present     General:  Alert, oriented and cooperative. Patient is in no acute distress.  Skin: Skin is warm and dry. No rash noted.   Cardiovascular: Normal heart rate noted  Respiratory: Normal respiratory effort, no problems with respiration noted  Abdomen: Soft, gravid, appropriate for gestational age. Pain/Pressure: Absent     Pelvic:  Cervical exam deferred        Extremities: Normal range of motion.     Mental Status: Normal mood and affect. Normal behavior. Normal judgment and thought content.   Assessment   20 y.o. G2P1001 at [redacted]w[redacted]d by  02/09/2021, by Ultrasound presenting for routine prenatal visit  Plan   pregnancy 1 Problems (from 07/02/20 to present)    Problem Noted Resolved   Supervision of other normal pregnancy, antepartum 07/02/2020 by [name], CNM No   Overview Addendum 11/03/2020  3:29 PM by [name], CNM      Nursing Staff Provider  Office Location  [facility] Dating   6 wk [place]  Language  English Anatomy US   incomplete  Flu Vaccine   Genetic Screen  NIPS: normal XY  TDaP vaccine    Glucose testing   Covid unvaccinated   LAB RESULTS   Rhogam  Not needed Blood Type O/Positive/-- (06/24 1412)   Feeding Plan Pumping Antibody Negative (06/24 1412)  Contraception Nexplanon Rubella 6.85 (06/24 1412)  Circumcision  RPR Reactive (06/24 1412)   Pediatrician   HBsAg Negative (06/24 1412)   Support Person FOB: Starain HIV Non Reactive (06/24 1412)  Prenatal Classes discussed Varicella   immune    GBS  (For PCN allergy, check sensitivities)   BTL Consent     VBAC Consent  Pap  under 21    Hgb Electro      CF      SMA          Closely spaced pregnancies  History of syphilis- treated February 2021           Preterm labor symptoms and general obstetric precautions including but not limited to vaginal bleeding, contractions, leaking of fluid and fetal movement were reviewed in detail with the patient. Please refer to After Visit Summary for other counseling recommendations.   Return in about 3 weeks (around 11/24/2020) for return OB, GTT, labs.  [name], CNM  11/03/2020 3:31 PM

## 2020-11-12 ENCOUNTER — Other Ambulatory Visit: Payer: Self-pay

## 2020-11-12 ENCOUNTER — Observation Stay
Admission: EM | Admit: 2020-11-12 | Discharge: 2020-11-12 | Disposition: A | Discharge status: Home / Self Care | Payer: Medicaid Other | Attending: Obstetrics and Gynecology | Admitting: Obstetrics and Gynecology

## 2020-11-12 ENCOUNTER — Encounter: Payer: Self-pay | Admitting: Obstetrics and Gynecology

## 2020-11-12 DIAGNOSIS — O26892 Other specified pregnancy related conditions, second trimester: Principal | ICD-10-CM | POA: Insufficient documentation

## 2020-11-12 DIAGNOSIS — Z3480 Encounter for supervision of other normal pregnancy, unspecified trimester: Secondary | ICD-10-CM

## 2020-11-12 DIAGNOSIS — Z3A27 27 weeks gestation of pregnancy: Secondary | ICD-10-CM | POA: Diagnosis not present

## 2020-11-12 DIAGNOSIS — O26893 Other specified pregnancy related conditions, third trimester: Secondary | ICD-10-CM | POA: Diagnosis present

## 2020-11-12 DIAGNOSIS — R102 Pelvic and perineal pain: Secondary | ICD-10-CM | POA: Insufficient documentation

## 2020-11-12 LAB — URINALYSIS, COMPLETE (UACMP) WITH MICROSCOPIC
Bilirubin Urine: NEGATIVE
Glucose, UA: NEGATIVE mg/dL
Hgb urine dipstick: NEGATIVE
Ketones, ur: NEGATIVE mg/dL
Nitrite: NEGATIVE
Protein, ur: NEGATIVE mg/dL
Specific Gravity, Urine: 1.017 (ref 1.005–1.030)
pH: 8 (ref 5.0–8.0)

## 2020-11-12 MED ORDER — ACETAMINOPHEN 325 MG PO TABS: 650.0000 mg | ORAL_TABLET | ORAL | Status: DC | PRN

## 2020-11-12 NOTE — Discharge Summary (Signed)
Physician Final Progress Note  Patient ID: Kristina Frost MRN: [id number] DOB/AGE: January 08, 2001 20 y.o.  Admit date: 11/12/2020 Admitting provider: Vena Austria, MD Discharge date: 11/12/2020   Admission Diagnoses: Pelvic pressure in pregnancy  Discharge Diagnoses:  Active Problems:   Pelvic pressure in pregnancy, antepartum, third trimester   20 y.o. G2P1001 at [redacted]w[redacted]d by Estimated Date of Delivery: 02/09/21 presenting with pelvic pressure in pregnancy.  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  Patient has a short interval pregnancy.  She denies dysuria, flank pain.  +FM, no LOF, no VB, no contractions.  UA sent and negative, will send urine culture.  Temperature 98.5 F (36.9 C), temperature source Oral, height 5\' 4"  (1.626 m), weight 66.2 kg, unknown if currently breastfeeding.  pregnancy 1 Problems (from 07/02/20 to present)     Problem Noted Resolved   Supervision of other normal pregnancy, antepartum 07/02/2020 by 09/01/2020, CNM No   Overview Addendum 11/03/2020  3:29 PM by 01/03/2021, CNM      Nursing Staff Provider  Office Location  [facility] Dating   6 wk [name]  Language  English Anatomy US   incomplete  Flu Vaccine   Genetic Screen  NIPS: normal XY  TDaP vaccine    Glucose testing   Covid unvaccinated   LAB RESULTS   Rhogam  Not needed Blood Type O/Positive/-- (06/24 1412)   Feeding Plan Pumping Antibody Negative (06/24 1412)  Contraception Nexplanon Rubella 6.85 (06/24 1412)  Circumcision  RPR Reactive (06/24 1412)   Pediatrician   HBsAg Negative (06/24 1412)   Support Person FOB: Starain HIV Non Reactive (06/24 1412)  Prenatal Classes discussed Varicella   immune    GBS  (For PCN allergy, check sensitivities)   BTL Consent     VBAC Consent  Pap  under 21    Hgb Electro      CF      SMA          Closely spaced pregnancies  History of syphilis- treated February 2021             Consults: None  Significant Findings/ Diagnostic Studies:  Results for orders placed or performed during the hospital encounter of 11/12/20 (from the past 24 hour(s))  Urinalysis, Complete w Microscopic Urine, Clean Catch     Status: Abnormal   Collection Time: 11/12/20  5:15 PM  Result Value Ref Range   Color, Urine YELLOW (A) YELLOW   APPearance CLOUDY (A) CLEAR   Specific Gravity, Urine 1.017 1.005 - 1.030   pH 8.0 5.0 - 8.0   Glucose, UA NEGATIVE NEGATIVE mg/dL   Hgb urine dipstick NEGATIVE NEGATIVE   Bilirubin Urine NEGATIVE NEGATIVE   Ketones, ur NEGATIVE NEGATIVE mg/dL   Protein, ur NEGATIVE NEGATIVE mg/dL   Nitrite NEGATIVE NEGATIVE   Leukocytes,Ua TRACE (A) NEGATIVE   RBC / HPF 0-5 0 - 5 RBC/hpf   WBC, UA 6-10 0 - 5 WBC/hpf   Bacteria, UA RARE (A) NONE SEEN   Squamous Epithelial / LPF 6-10 0 - 5   Mucus PRESENT    Amorphous Crystal PRESENT      Procedures: NST Baseline: 125 Variability: moderate Accelerations: present Decelerations: absent Tocometry: none The patient was monitored for 30 minutes, fetal heart rate tracing was deemed reactive, category I tracing,  CPT 01/12/21   Discharge Condition: good  Disposition: Discharge disposition: 01-Home or Self Care  Diet: Regular diet  Discharge Activity: Activity as tolerated  Discharge Instructions     Discharge activity:  No Restrictions   Complete by: As directed    Discharge diet:  No restrictions   Complete by: As directed    No sexual activity restrictions   Complete by: As directed    Notify physician for a general feeling that "something is not right"   Complete by: As directed    Notify physician for increase or change in vaginal discharge   Complete by: As directed    Notify physician for intestinal cramps, with or without diarrhea, sometimes described as "gas pain"   Complete by: As directed    Notify physician for leaking of fluid   Complete by: As directed    Notify  physician for low, dull backache, unrelieved by heat or Tylenol   Complete by: As directed    Notify physician for menstrual like cramps   Complete by: As directed    Notify physician for pelvic pressure   Complete by: As directed    Notify physician for uterine contractions.  These may be painless and feel like the uterus is tightening or the baby is  "balling up"   Complete by: As directed    Notify physician for vaginal bleeding   Complete by: As directed    PRETERM LABOR:  Includes any of the follwing symptoms that occur between 20 - [redacted] weeks gestation.  If these symptoms are not stopped, preterm labor can result in preterm delivery, placing your baby at risk   Complete by: As directed       Allergies as of 11/12/2020   No Known Allergies      Medication List     STOP taking these medications    ondansetron 4 MG disintegrating tablet Commonly known as: Zofran ODT       TAKE these medications    prenatal multivitamin Tabs tablet Take 1 tablet by mouth daily at 12 noon.         Total time spent taking care of this patient: 30 minutes  Signed: Vena Austria 11/12/2020, 6:27 PM

## 2020-11-12 NOTE — OB Triage Note (Signed)
Pt is a G2P1 at [redacted]w[redacted]d that presents from ED with c/o of lower abdominal cramping and pressure. 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 Pt denies VB, LOF and states positive FM. EFM applied and initial FHT 145. VSS. Provider aware of patients arrival to unit and orders placed.

## 2020-11-12 NOTE — OB Triage Note (Signed)
Patient Discharged home per provider. Pt instructed to keep all follow up appointments with her provider. AVS given to patient and RN answered all questions and patient has no further questions at this time. Pt discharged home in stable condition with significant other.   °

## 2020-11-14 LAB — URINE CULTURE

## 2020-11-17 ENCOUNTER — Encounter: Payer: Medicaid Other | Admitting: Obstetrics & Gynecology

## 2020-11-17 ENCOUNTER — Other Ambulatory Visit: Payer: Medicaid Other

## 2020-11-20 ENCOUNTER — Ambulatory Visit (INDEPENDENT_AMBULATORY_CARE_PROVIDER_SITE_OTHER): Payer: Medicaid Other | Admitting: Advanced Practice Midwife

## 2020-11-20 ENCOUNTER — Other Ambulatory Visit: Payer: Medicaid Other

## 2020-11-20 ENCOUNTER — Other Ambulatory Visit: Payer: Self-pay

## 2020-11-20 ENCOUNTER — Encounter: Payer: Self-pay | Admitting: Advanced Practice Midwife

## 2020-11-20 VITALS — BP 122/74 | Wt 152.0 lb

## 2020-11-20 DIAGNOSIS — O26843 Uterine size-date discrepancy, third trimester: Secondary | ICD-10-CM

## 2020-11-20 DIAGNOSIS — Z3483 Encounter for supervision of other normal pregnancy, third trimester: Secondary | ICD-10-CM

## 2020-11-20 DIAGNOSIS — Z369 Encounter for antenatal screening, unspecified: Secondary | ICD-10-CM

## 2020-11-20 DIAGNOSIS — Z130 Encounter for screening for diseases of the blood and blood-forming organs and certain disorders involving the immune mechanism: Secondary | ICD-10-CM

## 2020-11-20 DIAGNOSIS — Z113 Encounter for screening for infections with a predominantly sexual mode of transmission: Secondary | ICD-10-CM

## 2020-11-20 DIAGNOSIS — Z3A28 28 weeks gestation of pregnancy: Secondary | ICD-10-CM

## 2020-11-20 DIAGNOSIS — Z131 Encounter for screening for diabetes mellitus: Secondary | ICD-10-CM

## 2020-11-20 NOTE — Progress Notes (Signed)
No vb. No lof. 28 week labs today.  

## 2020-11-20 NOTE — Progress Notes (Signed)
  Routine Prenatal Care Visit  Subjective  Kristina Frost is a 20 y.o. G2P1001 at [redacted]w[redacted]d being seen today for ongoing prenatal care.  She is currently monitored for the following issues for this low-risk pregnancy and has Supervision of other normal pregnancy, antepartum and Pelvic pressure in pregnancy, antepartum, third trimester on their problem list.  ----------------------------------------------------------------------------------- Patient reports heartburn. Comfort measures and safe OTC meds reviewed  Contractions: Not present. Vag. Bleeding: None.  Movement: Present. Leaking Fluid denies.  ----------------------------------------------------------------------------------- The following portions of the patient's history were reviewed and updated as appropriate: allergies, current medications, past family history, past medical history, past social history, past surgical history and problem list. Problem list updated.  Objective  Blood pressure 122/74, weight 152 lb (68.9 kg) Pregravid weight 133 lb (60.3 kg) Total Weight Gain 19 lb (8.618 kg) Urinalysis: Urine Protein    Urine Glucose    Fetal Status: Fetal Heart Rate (bpm): 148 Fundal Height: 27 cm Movement: Present     General:  Alert, oriented and cooperative. Patient is in no acute distress.  Skin: Skin is warm and dry. No rash noted.   Cardiovascular: Normal heart rate noted  Respiratory: Normal respiratory effort, no problems with respiration noted  Abdomen: Soft, gravid, appropriate for gestational age. Pain/Pressure: Absent     Pelvic:  Cervical exam deferred        Extremities: Normal range of motion.  Edema: None  Mental Status: Normal mood and affect. Normal behavior. Normal judgment and thought content.   Assessment   20 y.o. G2P1001 at [redacted]w[redacted]d by  02/09/2021, by Ultrasound presenting for routine prenatal visit  Plan   pregnancy 1 Problems (from 07/02/20 to present)    Problem Noted Resolved   Supervision of other  normal pregnancy, antepartum 07/02/2020 by [name], CNM No   Overview Addendum 11/03/2020  3:29 PM by [name], CNM      Nursing Staff Provider  Office Location  [facility] Dating   6 wk [place]  Language  English Anatomy US   incomplete  Flu Vaccine   Genetic Screen  NIPS: normal XY  TDaP vaccine    Glucose testing   Covid unvaccinated   LAB RESULTS   Rhogam  Not needed Blood Type O/Positive/-- (06/24 1412)   Feeding Plan Pumping Antibody Negative (06/24 1412)  Contraception Nexplanon Rubella 6.85 (06/24 1412)  Circumcision  RPR Reactive (06/24 1412)   Pediatrician   HBsAg Negative (06/24 1412)   Support Person FOB: Starain HIV Non Reactive (06/24 1412)  Prenatal Classes discussed Varicella   immune    GBS  (For PCN allergy, check sensitivities)   BTL Consent     VBAC Consent  Pap  under 21    Hgb Electro      CF      SMA          Closely spaced pregnancies  History of syphilis- treated February 2021           Preterm labor symptoms and general obstetric precautions including but not limited to vaginal bleeding, contractions, leaking of fluid and fetal movement were reviewed in detail with the patient. Please refer to After Visit Summary for other counseling recommendations.   Return in about 2 weeks (around 12/04/2020) for growth scan and rob after.  Tresea Mall, CNM 11/20/2020 10:50 AM

## 2020-11-22 LAB — 28 WEEK RH+PANEL
Basophils Absolute: 0 10*3/uL (ref 0.0–0.2)
Basos: 0 %
EOS (ABSOLUTE): 0.1 10*3/uL (ref 0.0–0.4)
Eos: 1 %
Gestational Diabetes Screen: 104 mg/dL (ref 70–139)
HIV Screen 4th Generation wRfx: NONREACTIVE
Hematocrit: 30.8 % — ABNORMAL LOW (ref 34.0–46.6)
Hemoglobin: 10 g/dL — ABNORMAL LOW (ref 11.1–15.9)
Immature Grans (Abs): 0.1 10*3/uL (ref 0.0–0.1)
Immature Granulocytes: 1 %
Lymphocytes Absolute: 1.8 10*3/uL (ref 0.7–3.1)
Lymphs: 23 %
MCH: 28.7 pg (ref 26.6–33.0)
MCHC: 32.5 g/dL (ref 31.5–35.7)
MCV: 89 fL (ref 79–97)
Monocytes Absolute: 0.5 10*3/uL (ref 0.1–0.9)
Monocytes: 6 %
Neutrophils Absolute: 5.6 10*3/uL (ref 1.4–7.0)
Neutrophils: 69 %
Platelets: 262 10*3/uL (ref 150–450)
RBC: 3.48 x10E6/uL — ABNORMAL LOW (ref 3.77–5.28)
RDW: 12.7 % (ref 11.7–15.4)
RPR Ser Ql: REACTIVE — AB
WBC: 8 10*3/uL (ref 3.4–10.8)

## 2020-11-22 LAB — RPR, QUANT+TP ABS (REFLEX)
Rapid Plasma Reagin, Quant: 1:2 {titer} — ABNORMAL HIGH
T Pallidum Abs: REACTIVE — AB

## 2020-11-25 NOTE — Telephone Encounter (Signed)
Nexplanon rcvd/charged 06/05/2019 

## 2020-12-05 ENCOUNTER — Ambulatory Visit: Payer: Medicaid Other

## 2020-12-05 ENCOUNTER — Encounter: Payer: Medicaid Other | Admitting: Advanced Practice Midwife

## 2020-12-05 DIAGNOSIS — Z3483 Encounter for supervision of other normal pregnancy, third trimester: Secondary | ICD-10-CM

## 2020-12-05 DIAGNOSIS — Z369 Encounter for antenatal screening, unspecified: Secondary | ICD-10-CM

## 2020-12-05 DIAGNOSIS — O26843 Uterine size-date discrepancy, third trimester: Secondary | ICD-10-CM

## 2020-12-09 ENCOUNTER — Encounter: Payer: Self-pay | Admitting: Advanced Practice Midwife

## 2020-12-09 ENCOUNTER — Other Ambulatory Visit: Payer: Self-pay

## 2020-12-09 ENCOUNTER — Ambulatory Visit (INDEPENDENT_AMBULATORY_CARE_PROVIDER_SITE_OTHER): Payer: Medicaid Other | Admitting: Advanced Practice Midwife

## 2020-12-09 VITALS — BP 110/70 | Ht 64.0 in | Wt 156.0 lb

## 2020-12-09 DIAGNOSIS — Z3A31 31 weeks gestation of pregnancy: Secondary | ICD-10-CM

## 2020-12-09 DIAGNOSIS — Z3480 Encounter for supervision of other normal pregnancy, unspecified trimester: Secondary | ICD-10-CM

## 2020-12-09 NOTE — Progress Notes (Signed)
  Routine Prenatal Care Visit  Subjective  Kristina Frost is a 20 y.o. G2P1001 at [redacted]w[redacted]d being seen today for ongoing prenatal care.  She is currently monitored for the following issues for this low-risk pregnancy and has Supervision of other normal pregnancy, antepartum and Pelvic pressure in pregnancy, antepartum, third trimester on their problem list.  ----------------------------------------------------------------------------------- Patient reports occasional pelvic cramping.   Contractions: Not present. Vag. Bleeding: None.  Movement: Present. Leaking Fluid denies.  ----------------------------------------------------------------------------------- The following portions of the patient's history were reviewed and updated as appropriate: allergies, current medications, past family history, past medical history, past social history, past surgical history and problem list. Problem list updated.  Objective  Blood pressure 110/70, height 5\' 4"  (1.626 m), weight 156 lb (70.8 kg) Pregravid weight 133 lb (60.3 kg) Total Weight Gain 23 lb (10.4 kg) Urinalysis: Urine Protein    Urine Glucose    Fetal Status: Fetal Heart Rate (bpm): 137 Fundal Height: 31 cm Movement: Present     General:  Alert, oriented and cooperative. Patient is in no acute distress.  Skin: Skin is warm and dry. No rash noted.   Cardiovascular: Normal heart rate noted  Respiratory: Normal respiratory effort, no problems with respiration noted  Abdomen: Soft, gravid, appropriate for gestational age. Pain/Pressure: Present     Pelvic:  Cervical exam deferred        Extremities: Normal range of motion.  Edema: None  Mental Status: Normal mood and affect. Normal behavior. Normal judgment and thought content.   Assessment   20 y.o. G2P1001 at [redacted]w[redacted]d by  02/09/2021, by Ultrasound presenting for routine prenatal visit  Plan   pregnancy 1 Problems (from 07/02/20 to present)    Problem Noted Resolved   Supervision of other  normal pregnancy, antepartum 07/02/2020 by 09/01/2020, CNM No   Overview Addendum 11/03/2020  3:29 PM by 01/03/2021, CNM      Nursing Staff Provider  Office Location  [facility] Dating   6 wk [name]  Language  English Anatomy US   incomplete  Flu Vaccine   Genetic Screen  NIPS: normal XY  TDaP vaccine    Glucose testing   Covid unvaccinated   LAB RESULTS   Rhogam  Not needed Blood Type O/Positive/-- (06/24 1412)   Feeding Plan Pumping Antibody Negative (06/24 1412)  Contraception IUD- unsure which Rubella 6.85 (06/24 1412)  Circumcision  RPR Reactive (06/24 1412)   Pediatrician   HBsAg Negative (06/24 1412)   Support Person FOB: Starain HIV Non Reactive (06/24 1412)  Prenatal Classes discussed Varicella   immune    GBS  (For PCN allergy, check sensitivities)   BTL Consent     VBAC Consent NA Pap  under 21   Hx VAVD Hgb Electro      CF      SMA          Closely spaced pregnancies  History of syphilis- treated February 2021           Preterm labor symptoms and general obstetric precautions including but not limited to vaginal bleeding, contractions, leaking of fluid and fetal movement were reviewed in detail with the patient.    Return in about 2 weeks (around 12/23/2020) for rob (reschedule growth scan if possible).  12/25/2020, CNM 12/09/2020 3:29 PM

## 2020-12-23 ENCOUNTER — Encounter: Payer: Self-pay | Admitting: Obstetrics and Gynecology

## 2020-12-23 ENCOUNTER — Observation Stay
Admission: EM | Admit: 2020-12-23 | Discharge: 2020-12-23 | Disposition: A | Discharge status: Home / Self Care | Payer: Medicaid Other | Attending: Obstetrics and Gynecology | Admitting: Obstetrics and Gynecology

## 2020-12-23 ENCOUNTER — Other Ambulatory Visit: Payer: Self-pay

## 2020-12-23 ENCOUNTER — Encounter: Payer: Medicaid Other | Admitting: Obstetrics

## 2020-12-23 DIAGNOSIS — Y9301 Activity, walking, marching and hiking: Secondary | ICD-10-CM | POA: Diagnosis not present

## 2020-12-23 DIAGNOSIS — R109 Unspecified abdominal pain: Secondary | ICD-10-CM | POA: Diagnosis not present

## 2020-12-23 DIAGNOSIS — O9A219 Injury, poisoning and certain other consequences of external causes complicating pregnancy, unspecified trimester: Secondary | ICD-10-CM | POA: Diagnosis not present

## 2020-12-23 DIAGNOSIS — W109XXA Fall (on) (from) unspecified stairs and steps, initial encounter: Secondary | ICD-10-CM | POA: Insufficient documentation

## 2020-12-23 DIAGNOSIS — Z3480 Encounter for supervision of other normal pregnancy, unspecified trimester: Secondary | ICD-10-CM

## 2020-12-23 DIAGNOSIS — W19XXXA Unspecified fall, initial encounter: Secondary | ICD-10-CM | POA: Diagnosis present

## 2020-12-23 NOTE — OB Triage Note (Signed)
Pt Kristina Frost 20 y.o. presents to labor and delivery triage reporting that she fell at home. She had decreased fetal movement after the fall. Pt is a G2P1001 at [redacted]w[redacted]d . Pt denies signs and symptoms consistent with rupture of membranes or active vaginal bleeding. Pt denies contractions. External FM and TOCO applied to non-tender abdomen and assessing. Initial FHR 150. Vital signs obtained and within normal limits. Provider notified of pt.

## 2020-12-23 NOTE — Discharge Summary (Signed)
Physician Discharge Summary   Patient ID: Kristina Frost [id number] 20 y.o. 04-06-2000  Admit date: 12/23/2020  Discharge date and time: 12/23/2020 21:35   Admitting Physician: Natale Milch, MD   Discharge Physician: Adelene Idler MD  Admission Diagnoses: Fall [W19.XXXA]  Discharge Diagnoses: Same as above  Admission Condition: good  Discharged Condition: good  Indication for Admission: Evaluation after fall  Hospital Course: Patient presented to labor and delivery with complaint of a fall. She reports falling while walking up steps and striking her abdomen. She denies vaginal bleeding or contractions. Fetal monitoring was reassuring and she was discharged home with precautions.   NST: 120 bpm baseline, moderate variability, 15x15 accelerations, no decelerations. Tocometer : quiet   Consults:  none  Significant Diagnostic Studies: NST  Treatments: none  Discharge Exam: BP 115/73 (BP Location: Right Arm)   Pulse (!) 104   Temp 98.7 F (37.1 C) (Oral)   Resp 16   Ht 5\' 4"  (1.626 m)   Wt 70.8 kg   LMP  (LMP Unknown)   BMI 26.78 kg/m  General appearance: alert and cooperative  Disposition: Discharge disposition: 01-Home or Self Care      Patient Instructions:  Allergies as of 12/23/2020   No Known Allergies      Medication List     TAKE these medications    prenatal multivitamin Tabs tablet Take 1 tablet by mouth daily at 12 noon.   prenatal vitamin w/FE, FA 29-1 MG Chew chewable tablet Chew 1 tablet by mouth daily at 12 noon.       Activity: activity as tolerated Diet: regular diet Wound Care: none needed  Follow-up with [facility] as planned  Signed: Tyreisha Ungar R Ahleah Simko 12/23/2020 9:34 PM

## 2020-12-23 NOTE — OB Triage Note (Signed)
Pt discharged home per order.  Pt stable and ambulatory and an After Visit Summary was printed and given to the patient. Discharge education completed with patient including follow up instructions, and medication list. Pt received labor and bleeding precautions. Patient able to verbalize understanding, all questions fully answered upon discharge. Patient instructed to return to ED, call 911, or call MD for any changes in condition. Pt discharged home via personal vehicle.

## 2020-12-24 ENCOUNTER — Ambulatory Visit: Payer: Medicaid Other

## 2020-12-29 ENCOUNTER — Other Ambulatory Visit: Payer: Self-pay

## 2020-12-29 ENCOUNTER — Encounter: Payer: Medicaid Other | Admitting: Advanced Practice Midwife

## 2020-12-29 DIAGNOSIS — U071 COVID-19: Secondary | ICD-10-CM | POA: Insufficient documentation

## 2020-12-29 DIAGNOSIS — Z5321 Procedure and treatment not carried out due to patient leaving prior to being seen by health care provider: Secondary | ICD-10-CM | POA: Diagnosis not present

## 2020-12-29 DIAGNOSIS — R509 Fever, unspecified: Secondary | ICD-10-CM | POA: Diagnosis present

## 2020-12-29 LAB — RESP PANEL BY RT-PCR (FLU A&B, COVID) ARPGX2
Influenza A by PCR: NEGATIVE
Influenza B by PCR: NEGATIVE
SARS Coronavirus 2 by RT PCR: POSITIVE — AB

## 2020-12-29 MED ORDER — ONDANSETRON HCL 4 MG/2ML IJ SOLN: 4.0000 mg | Freq: Once | INTRAMUSCULAR | Status: DC

## 2020-12-29 MED ORDER — LACTATED RINGERS IV BOLUS: 1000.0000 mL | Freq: Once | INTRAVENOUS | Status: DC

## 2020-12-29 MED ORDER — ACETAMINOPHEN 500 MG PO TABS: 1000.0000 mg | ORAL_TABLET | Freq: Once | ORAL | Status: DC

## 2020-12-29 NOTE — ED Triage Notes (Signed)
Pt in with co vomiting since last night, states did have a fever of 102 today. Pt is [redacted] weeks pregnant, but denies any abd pain or pregnancy complaints. Pt does have congestion and cough.

## 2020-12-30 ENCOUNTER — Emergency Department
Admission: EM | Admit: 2020-12-30 | Discharge: 2020-12-30 | Disposition: A | Discharge status: Home / Self Care | Payer: Medicaid Other | Attending: Emergency Medicine | Admitting: Emergency Medicine

## 2020-12-30 NOTE — ED Notes (Signed)
Pt stated she was not going to wait she just wanted to know if she had the flu. Advised patient to have further workup but pt stated she can see her OB in the am. Pt stated she did want Covid/flu swab and would check Mychart for results.

## 2020-12-30 NOTE — ED Notes (Addendum)
Pt noted to be walking out of waiting room with visitor.

## 2020-12-31 ENCOUNTER — Ambulatory Visit: Payer: Medicaid Other

## 2021-01-01 ENCOUNTER — Ambulatory Visit (INDEPENDENT_AMBULATORY_CARE_PROVIDER_SITE_OTHER): Payer: Medicaid Other | Admitting: Obstetrics

## 2021-01-01 ENCOUNTER — Encounter: Payer: Medicaid Other | Admitting: Obstetrics

## 2021-01-01 ENCOUNTER — Telehealth: Payer: Medicaid Other | Admitting: Obstetrics

## 2021-01-01 ENCOUNTER — Other Ambulatory Visit: Payer: Self-pay

## 2021-01-01 DIAGNOSIS — Z3483 Encounter for supervision of other normal pregnancy, third trimester: Secondary | ICD-10-CM

## 2021-01-01 DIAGNOSIS — Z3A34 34 weeks gestation of pregnancy: Secondary | ICD-10-CM | POA: Diagnosis not present

## 2021-01-01 DIAGNOSIS — Z3480 Encounter for supervision of other normal pregnancy, unspecified trimester: Secondary | ICD-10-CM

## 2021-01-01 NOTE — Progress Notes (Signed)
Routine Prenatal Care Visit- Virtual Visit  Subjective   Virtual Visit via Telephone Note  I connected with@ on 01/01/21 at  2:50 PM EST by telephone and verified that I am speaking with the correct person using two identifiers.   I discussed the limitations, risks, security and privacy concerns of performing an evaluation and management service by telephone and the availability of in person appointments. I also discussed with the patient that there may be a patient responsible charge related to this service. The patient expressed understanding and agreed to proceed.  The patient was at home I spoke with the patient from my  Office phone The names of people involved in this encounter were: Paula Compton CNM and  , and Kristina Frost is a 20 y.o. G2P1001 at [redacted]w[redacted]d being seen today for ongoing prenatal care.  She is currently monitored for the following issues for this low-risk pregnancy and has Supervision of other normal pregnancy, antepartum; Pelvic pressure in pregnancy, antepartum, third trimester; and Fall on their problem list.  ----------------------------------------------------------------------------------- Patient reports no complaints.    .  .   . Denies leaking of fluid.  ----------------------------------------------------------------------------------- The following portions of the patient's history were reviewed and updated as appropriate: allergies, current medications, past family history, past medical history, past social history, past surgical history and problem list. Problem list updated.   Objective  unknown if currently breastfeeding. Pregravid weight 133 lb (60.3 kg) Total Weight Gain 23 lb (10.4 kg) Urinalysis:      Fetal Status:           Physical Exam could not be performed. Because of the COVID-19 outbreak this visit was performed over the phone and not in person.   Assessment   20 y.o. G2P1001 at [redacted]w[redacted]d by  02/09/2021, by  Ultrasound presenting for routine prenatal visit  Plan   pregnancy 1 Problems (from 07/02/20 to present)    Problem Noted Resolved   Supervision of other normal pregnancy, antepartum 07/02/2020 by [name], CNM No   Overview Addendum 12/09/2020  3:31 PM by Tresea Mall, CNM      Nursing Staff Provider  Office Location  [facility] Dating   6 wk [place]  Language  English Anatomy US   incomplete  Flu Vaccine   Genetic Screen  NIPS: normal XY  TDaP vaccine    Glucose testing   Covid unvaccinated   LAB RESULTS   Rhogam  Not needed Blood Type O/Positive/-- (06/24 1412)   Feeding Plan Pumping Antibody Negative (06/24 1412)  Contraception IUD- unsure which Rubella 6.85 (06/24 1412)  Circumcision  RPR Reactive (06/24 1412)   Pediatrician   HBsAg Negative (06/24 1412)   Support Person FOB: Starain HIV Non Reactive (06/24 1412)  Prenatal Classes discussed Varicella   immune    GBS  (For PCN allergy, check sensitivities)   BTL Consent     VBAC Consent NA Pap  under 21   Hx VAVD Hgb Electro      CF      SMA          Closely spaced pregnancies  History of syphilis- treated February 2021           Gestational age appropriate obstetric precautions including but not limited to vaginal bleeding, contractions, leaking of fluid and fetal movement were reviewed in detail with the patient.     Follow Up Instructions: Her baby is moving well. She does have some BH ucs, but  denies any LOF or vaginal bleeding   I discussed the assessment and treatment plan with the patient. The patient was provided an opportunity to ask questions and all were answered. The patient agreed with the plan and demonstrated an understanding of the instructions.   The patient was advised to call back or seek an in-person evaluation if the symptoms worsen or if the condition fails to improve as anticipated.  I provided 15 minutes of non-face-to-face time during this encounter.  Return in about 2 weeks (around  01/15/2021) for return OB,.  [name], CNM  01/01/2021 3:22 PM   [facility], [facility] 01/01/2021 3:21 PM

## 2021-01-09 ENCOUNTER — Other Ambulatory Visit: Payer: Self-pay

## 2021-01-09 ENCOUNTER — Ambulatory Visit
Admission: RE | Admit: 2021-01-09 | Discharge: 2021-01-09 | Disposition: A | Discharge status: Home / Self Care | Payer: Medicaid Other | Source: Ambulatory Visit | Attending: Advanced Practice Midwife | Admitting: Advanced Practice Midwife

## 2021-01-09 ENCOUNTER — Other Ambulatory Visit: Payer: Self-pay | Admitting: Advanced Practice Midwife

## 2021-01-09 ENCOUNTER — Encounter: Payer: Self-pay | Admitting: Obstetrics

## 2021-01-09 DIAGNOSIS — O365930 Maternal care for other known or suspected poor fetal growth, third trimester, not applicable or unspecified: Secondary | ICD-10-CM

## 2021-01-09 DIAGNOSIS — Z3A35 35 weeks gestation of pregnancy: Secondary | ICD-10-CM

## 2021-01-09 DIAGNOSIS — Z3483 Encounter for supervision of other normal pregnancy, third trimester: Secondary | ICD-10-CM | POA: Insufficient documentation

## 2021-01-09 DIAGNOSIS — Z369 Encounter for antenatal screening, unspecified: Secondary | ICD-10-CM

## 2021-01-09 DIAGNOSIS — O26843 Uterine size-date discrepancy, third trimester: Secondary | ICD-10-CM | POA: Insufficient documentation

## 2021-01-09 HISTORY — DX: Maternal care for other known or suspected poor fetal growth, third trimester, not applicable or unspecified: O36.5930

## 2021-01-09 NOTE — Progress Notes (Signed)
MFM referral and UA cord doppler ordered due to IUGR on u/s today.   CLINICAL DATA:  growth   EXAM: OBSTETRIC 14+ WK ULTRASOUND FOLLOW-UP   FINDINGS: Number of Fetuses: 1   Heart Rate:  126 bpm   Movement: Yes   Presentation: Cephalic   Previa: No   Placental Location: Anterior   Amniotic Fluid (Subjective): Normal   Amniotic Fluid (Objective):   AFI 12.3 cm (5%ile= 7.9 cm, 95%= 24.9 cm for 35 wks)   FETAL BIOMETRY   BPD:  8.5cm 34w 2d   HC:    30.8cm 34w 2d   AC:    28.3cm 32w 2d   FL:    6.7cm 34w 5d   Current Mean GA: 34w 1d [place] EDC: 02/19/2021   Assigned GA: 35w 4d Assigned EDC: 02/09/2021   Estimated Fetal Weight:  2,185g 6%ile, previously 38 %ile   FETAL ANATOMY   Lateral Ventricles: Previously seen   Thalami/CSP: Previously seen   Posterior Fossa: Previously seen   Nuchal Region: Previously seen   Upper Lip: Previously seen   Spine: Previously seen   4 Chamber Heart on Left: Previously seen   LVOT: Previously seen   RVOT: Previously seen   Stomach on Left: Appears normal   3 Vessel Cord: Previously seen   Cord Insertion site: Previously seen   Kidneys: Appears normal   Bladder: Appears normal   Extremities: Previously seen   Sex: Previously Seen   Technical Limitations: Fetal position and late gestational age   Maternal Findings:   Cervix:  Not evaluated.   IMPRESSION: 1. Single live intrauterine pregnancy as above, estimated age 48 weeks and 1 day. The assigned gestational age is 35 weeks and 4 days. There is less than expected interval fetal growth as well, with estimated fetal weight now at the sixth percentile, previously at the thirty-eighth percentile.   Electronically Signed   By: Sharlet Salina M.D.   On: 01/09/2021 15:20

## 2021-01-12 ENCOUNTER — Encounter: Payer: Medicaid Other | Admitting: Obstetrics

## 2021-01-12 ENCOUNTER — Other Ambulatory Visit: Payer: Self-pay | Admitting: Advanced Practice Midwife

## 2021-01-12 DIAGNOSIS — O0993 Supervision of high risk pregnancy, unspecified, third trimester: Secondary | ICD-10-CM

## 2021-01-12 DIAGNOSIS — O365930 Maternal care for other known or suspected poor fetal growth, third trimester, not applicable or unspecified: Secondary | ICD-10-CM

## 2021-01-12 DIAGNOSIS — Z3A36 36 weeks gestation of pregnancy: Secondary | ICD-10-CM

## 2021-01-12 NOTE — Progress Notes (Signed)
Mfm detail +14w u/s placed

## 2021-01-16 ENCOUNTER — Other Ambulatory Visit (HOSPITAL_COMMUNITY)
Admission: RE | Admit: 2021-01-16 | Discharge: 2021-01-16 | Disposition: A | Discharge status: Home / Self Care | Payer: Medicaid Other | Source: Ambulatory Visit | Attending: Obstetrics and Gynecology | Admitting: Obstetrics and Gynecology

## 2021-01-16 ENCOUNTER — Ambulatory Visit (INDEPENDENT_AMBULATORY_CARE_PROVIDER_SITE_OTHER): Payer: Medicaid Other | Admitting: Obstetrics and Gynecology

## 2021-01-16 ENCOUNTER — Encounter: Payer: Self-pay | Admitting: Obstetrics and Gynecology

## 2021-01-16 ENCOUNTER — Ambulatory Visit: Payer: Medicaid Other | Attending: Advanced Practice Midwife

## 2021-01-16 ENCOUNTER — Ambulatory Visit (HOSPITAL_BASED_OUTPATIENT_CLINIC_OR_DEPARTMENT_OTHER): Payer: Medicaid Other | Admitting: Obstetrics

## 2021-01-16 ENCOUNTER — Ambulatory Visit: Payer: Medicaid Other | Admitting: *Deleted

## 2021-01-16 ENCOUNTER — Encounter: Payer: Medicaid Other | Admitting: Obstetrics

## 2021-01-16 ENCOUNTER — Other Ambulatory Visit: Payer: Self-pay

## 2021-01-16 ENCOUNTER — Other Ambulatory Visit: Payer: Self-pay | Admitting: Advanced Practice Midwife

## 2021-01-16 VITALS — BP 125/81 | HR 85

## 2021-01-16 VITALS — BP 100/70 | Wt 160.4 lb

## 2021-01-16 DIAGNOSIS — O0993 Supervision of high risk pregnancy, unspecified, third trimester: Secondary | ICD-10-CM

## 2021-01-16 DIAGNOSIS — Z3A36 36 weeks gestation of pregnancy: Secondary | ICD-10-CM

## 2021-01-16 DIAGNOSIS — O365930 Maternal care for other known or suspected poor fetal growth, third trimester, not applicable or unspecified: Secondary | ICD-10-CM

## 2021-01-16 DIAGNOSIS — Z369 Encounter for antenatal screening, unspecified: Secondary | ICD-10-CM | POA: Diagnosis present

## 2021-01-16 NOTE — Patient Instructions (Signed)
Covid Testing at 01/19/2021 between 8-10:30 am, Drive up testing in front of the [employer]. Follow signs for Pre-admission testing. Please wear a mask.  Induction 01/20/2021 at 8 AM . Enter through the [facility] for an 8 AM induction. Please enter through the ER for a midnight or 5 AM induction.  Please eat a meal prior to your arrival.   Labor Induction Labor induction is when steps are taken to cause a pregnant woman to begin the labor process. Most women go into labor on their own between 37 weeks and 42 weeks of pregnancy. When this does not happen, or when there is a medical need for labor to begin, steps may be taken to induce, or bring on, labor. Labor induction causes a pregnant woman's uterus to contract. It also causes the cervix to soften (ripen), open (dilate), and thin out. Usually, labor is not induced before 39 weeks of pregnancy unless there is a medical reason to do so. When is labor induction considered? Labor induction may be right for you if: Your pregnancy lasts longer than 41 to 42 weeks. Your placenta is separating from your uterus (placental abruption). You have a rupture of membranes and your labor does not begin. You have health problems, like diabetes or high blood pressure (preeclampsia) during your pregnancy. Your baby has stopped growing or does not have enough amniotic fluid. Before labor induction begins, your health care provider will consider the following factors: Your medical condition and the baby's condition. How many weeks you have been pregnant. How mature the baby's lungs are. The condition of your cervix. The position of the baby. The size of your birth canal. Tell a health care provider about: Any allergies you have. All medicines you are taking, including vitamins, herbs, eye drops, creams, and over-the-counter medicines. Any problems you or your family members have had with anesthetic medicines. Any surgeries you have had. Any blood  disorders you have. Any medical conditions you have. What are the risks? Generally, this is a safe procedure. However, problems may occur, including: Failed induction. Changes in fetal heart rate, such as being too high, too low, or irregular (erratic). Infection in the mother or the baby. Increased risk of having a cesarean delivery. Breaking off (abruption) of the placenta from the uterus. This is rare. Rupture of the uterus. This is very rare. Your baby could fail to get enough blood flow or oxygen. This can be life-threatening. When induction is needed for medical reasons, the benefits generally outweigh the risks. What happens during the procedure? During the procedure, your health care provider will use one of these methods to induce labor: Stripping the membranes. In this method, the amniotic sac tissue is gently separated from the cervix. This causes the following to happen: Your cervix stretches, which in turn causes the release of prostaglandins. Prostaglandins induce labor and cause the uterus to contract. This procedure is often done in an office visit. You will be sent home to wait for contractions to begin. Prostaglandin medicine. This medicine starts contractions and causes the cervix to dilate and ripen. This can be taken by mouth (orally) or by being inserted into the vagina (suppository). Inserting a small, thin tube (catheter) with a balloon into the vagina and then expanding the balloon with water to dilate the cervix. Breaking the water. In this method, a small instrument is used to make a small hole in the amniotic sac. This eventually causes the amniotic sac to break. Contractions should begin within a  few hours. Medicine to trigger or strengthen contractions. This medicine is given through an IV that is inserted into a vein in your arm. This procedure may vary among health care providers and hospitals. Where to find more information March of D[employer]:  www.marchofdimes.org [employer] of [employer]stetricians and Gynecologists: www.acog.org Summary Labor induction causes a pregnant woman's uterus to contract. It also causes the cervix to soften (ripen), open (dilate), and thin out. Labor is usually not induced before 39 weeks of pregnancy unless there is a medical reason to do so. When induction is needed for medical reasons, the benefits generally outweigh the risks. Talk with your health care provider about which methods of labor induction are right for you. This information is not intended to replace advice given to you by your health care provider. Make sure you discuss any questions you have with your health care provider. Document Revised: 11/01/2019 Document Reviewed: 11/01/2019 Elsevier Patient Education  2022 [employer].

## 2021-01-16 NOTE — Progress Notes (Signed)
° ° °  Routine Prenatal Care Visit  Subjective  Kristina Frost is a 20 y.o. G2P1001 at [redacted]w[redacted]d being seen today for ongoing prenatal care.  She is currently monitored for the following issues for this high-risk pregnancy and has Supervision of high-risk pregnancy; Pelvic pressure in pregnancy, antepartum, third trimester; Fall; and Intrauterine growth restriction (IUGR) affecting care of mother, third trimester, single gestation on their problem list.  ----------------------------------------------------------------------------------- Patient reports no complaints.   Contractions: Irregular. Vag. Bleeding: None.  Movement: Present. Denies leaking of fluid.  ----------------------------------------------------------------------------------- The following portions of the patient's history were reviewed and updated as appropriate: allergies, current medications, past family history, past medical history, past social history, past surgical history and problem list. Problem list updated.   Objective  Blood pressure 100/70, weight 160 lb 6.4 oz (72.8 kg), unknown if currently breastfeeding. Pregravid weight 133 lb (60.3 kg) Total Weight Gain 27 lb 6.4 oz (12.4 kg) Urinalysis:      Fetal Status:     Movement: Present     General:  Alert, oriented and cooperative. Patient is in no acute distress.  Skin: Skin is warm and dry. No rash noted.   Cardiovascular: Normal heart rate noted  Respiratory: Normal respiratory effort, no problems with respiration noted  Abdomen: Soft, gravid, appropriate for gestational age. Pain/Pressure: Present     Pelvic:  Cervical exam deferred        Extremities: Normal range of motion.     Mental Status: Normal mood and affect. Normal behavior. Normal judgment and thought content.     Assessment   20 y.o. G2P1001 at [redacted]w[redacted]d by  02/09/2021, by Ultrasound presenting for routine prenatal visit  Plan   pregnancy 1 Problems (from 07/02/20 to present)     Problem Noted  Resolved   Supervision of high-risk pregnancy 07/02/2020 by [name], CNM No   Overview Addendum 12/09/2020  3:31 PM by Tresea Mall, CNM      Nursing Staff Provider  Office Location  [facility] Dating   6 wk [place]  Language  English Anatomy US   incomplete  Flu Vaccine   Genetic Screen  NIPS: normal XY  TDaP vaccine    Glucose testing   Covid unvaccinated   LAB RESULTS   Rhogam  Not needed Blood Type O/Positive/-- (06/24 1412)   Feeding Plan Pumping Antibody Negative (06/24 1412)  Contraception IUD- unsure which Rubella 6.85 (06/24 1412)  Circumcision  RPR Reactive (06/24 1412)   Pediatrician   HBsAg Negative (06/24 1412)   Support Person FOB: Starain HIV Non Reactive (06/24 1412)  Prenatal Classes discussed Varicella   immune    GBS  (For PCN allergy, check sensitivities)   BTL Consent     VBAC Consent NA Pap  under 21   Hx VAVD Hgb Electro      CF      SMA          Closely spaced pregnancies  History of syphilis- treated February 2021           IOL scheduled- orders placed GBS/ CT/ CT today  Gestational age appropriate obstetric precautions including but not limited to vaginal bleeding, contractions, leaking of fluid and fetal movement were reviewed in detail with the patient.    Return if symptoms worsen or fail to improve, for IOL on 12/20//2022.  Natale Milch MD [facility], [facility] 01/16/2021, 4:12 PM

## 2021-01-16 NOTE — Progress Notes (Signed)
MFM Note  Kristina Frost was seen due to fetal growth restriction noted during her ultrasounds performed by radiology at [facility].  She reports feeling fetal movements throughout the day.  She denies any significant past medical history and denies any problems in her current pregnancy.    She had a cell free DNA test drawn earlier in her pregnancy that indicated a low risk for trisomy 17, 41, and 13.    On today's exam, the EFW (5 pounds 4 ounces)  measures at the 7th percentile for her gestational age indicating fetal growth restriction.  Low normal amniotic fluid with a total AFI of 8.7 cm was noted.    The patient reports that her first child was delivered at term weighing about 5-1/2 pounds.  She was reassured that this baby will most likely be of similar weight.  A biophysical profile performed today due to fetal growth restriction was 8 out of 8.    Doppler studies of the umbilical arteries showed a normal S/D ratio of 2.59.  There were no signs of absent or reversed end-diastolic flow.    Due to IUGR, delivery is recommended at between 37 to 38 weeks (at the end of next week).    She should have another NST performed in your office early next week.  The patient and her partner are happy and comfortable with delivery at the end of next week.    They stated that all of their questions have been answered.  A total of 30 minutes was spent counseling and coordinating the care for this patient.  Greater than 50% of the time was spent in direct face-to-face contact.  Recommendations:  Repeat NST early next week in your office Delivery at between 37 to 38 weeks (end of next week)

## 2021-01-19 ENCOUNTER — Other Ambulatory Visit: Payer: Self-pay

## 2021-01-19 ENCOUNTER — Other Ambulatory Visit
Admission: RE | Admit: 2021-01-19 | Discharge: 2021-01-19 | Disposition: A | Discharge status: Home / Self Care | Payer: Medicaid Other | Source: Ambulatory Visit | Attending: Obstetrics and Gynecology | Admitting: Obstetrics and Gynecology

## 2021-01-19 DIAGNOSIS — Z20822 Contact with and (suspected) exposure to covid-19: Secondary | ICD-10-CM

## 2021-01-19 DIAGNOSIS — Z01812 Encounter for preprocedural laboratory examination: Secondary | ICD-10-CM | POA: Insufficient documentation

## 2021-01-20 ENCOUNTER — Inpatient Hospital Stay: Payer: Medicaid Other | Admitting: Anesthesiology

## 2021-01-20 ENCOUNTER — Inpatient Hospital Stay
Admission: RE | Admit: 2021-01-20 | Discharge: 2021-01-21 | DRG: 807 | Disposition: A | Discharge status: Home / Self Care | Payer: Medicaid Other | Attending: Obstetrics and Gynecology | Admitting: Obstetrics and Gynecology

## 2021-01-20 DIAGNOSIS — Z20822 Contact with and (suspected) exposure to covid-19: Secondary | ICD-10-CM | POA: Diagnosis present

## 2021-01-20 DIAGNOSIS — Z3A37 37 weeks gestation of pregnancy: Secondary | ICD-10-CM | POA: Diagnosis not present

## 2021-01-20 DIAGNOSIS — O365930 Maternal care for other known or suspected poor fetal growth, third trimester, not applicable or unspecified: Secondary | ICD-10-CM | POA: Diagnosis present

## 2021-01-20 DIAGNOSIS — O365990 Maternal care for other known or suspected poor fetal growth, unspecified trimester, not applicable or unspecified: Secondary | ICD-10-CM | POA: Diagnosis present

## 2021-01-20 LAB — CBC
HCT: 34.1 % — ABNORMAL LOW (ref 36.0–46.0)
Hemoglobin: 11.1 g/dL — ABNORMAL LOW (ref 12.0–15.0)
MCH: 26.6 pg (ref 26.0–34.0)
MCHC: 32.6 g/dL (ref 30.0–36.0)
MCV: 81.6 fL (ref 80.0–100.0)
Platelets: 191 10*3/uL (ref 150–400)
RBC: 4.18 MIL/uL (ref 3.87–5.11)
RDW: 15.1 % (ref 11.5–15.5)
WBC: 8.1 10*3/uL (ref 4.0–10.5)
nRBC: 0 % (ref 0.0–0.2)

## 2021-01-20 LAB — CULTURE, BETA STREP (GROUP B ONLY): Strep Gp B Culture: NEGATIVE

## 2021-01-20 LAB — TYPE AND SCREEN
ABO/RH(D): O POS
Antibody Screen: NEGATIVE

## 2021-01-20 LAB — SARS CORONAVIRUS 2 (TAT 6-24 HRS): SARS Coronavirus 2: NEGATIVE

## 2021-01-20 LAB — CERVICOVAGINAL ANCILLARY ONLY
Chlamydia: NEGATIVE
Comment: NEGATIVE
Comment: NEGATIVE
Comment: NORMAL
Neisseria Gonorrhea: NEGATIVE
Trichomonas: NEGATIVE

## 2021-01-20 MED ORDER — LIDOCAINE-EPINEPHRINE (PF) 1.5 %-1:200000 IJ SOLN
INTRAMUSCULAR | Status: DC | PRN
  Administered 2021-01-20: 3 mL via EPIDURAL

## 2021-01-20 MED ORDER — ONDANSETRON HCL 4 MG/2ML IJ SOLN: 4.0000 mg | Freq: Four times a day (QID) | INTRAMUSCULAR | Status: DC | PRN

## 2021-01-20 MED ORDER — MISOPROSTOL 25 MCG QUARTER TABLET
25.0000 ug | ORAL_TABLET | ORAL | Status: DC | PRN
  Filled 2021-01-20: qty 1

## 2021-01-20 MED ORDER — MISOPROSTOL 200 MCG PO TABS
ORAL_TABLET | ORAL | Status: AC
  Administered 2021-01-20: 10:00:00 25 ug via VAGINAL
  Filled 2021-01-20: qty 4

## 2021-01-20 MED ORDER — DOCUSATE SODIUM 100 MG PO CAPS
100.0000 mg | ORAL_CAPSULE | Freq: Two times a day (BID) | ORAL | Status: DC
  Administered 2021-01-20 – 2021-01-21 (×2): 100 mg via ORAL
  Filled 2021-01-20 (×2): qty 1

## 2021-01-20 MED ORDER — BUPIVACAINE HCL (PF) 0.25 % IJ SOLN
INTRAMUSCULAR | Status: DC | PRN
  Administered 2021-01-20 (×2): 4 mL via EPIDURAL

## 2021-01-20 MED ORDER — BUTORPHANOL TARTRATE 1 MG/ML IJ SOLN: 2.0000 mg | INTRAMUSCULAR | Status: DC | PRN

## 2021-01-20 MED ORDER — COCONUT OIL OIL: 1.0000 "application " | TOPICAL_OIL | Status: DC | PRN

## 2021-01-20 MED ORDER — IBUPROFEN 600 MG PO TABS
600.0000 mg | ORAL_TABLET | Freq: Four times a day (QID) | ORAL | Status: DC
  Administered 2021-01-20 – 2021-01-21 (×4): 600 mg via ORAL
  Filled 2021-01-20 (×4): qty 1

## 2021-01-20 MED ORDER — LIDOCAINE HCL (PF) 1 % IJ SOLN
30.0000 mL | INTRAMUSCULAR | Status: DC | PRN
  Filled 2021-01-20: qty 30

## 2021-01-20 MED ORDER — OXYTOCIN BOLUS FROM INFUSION
333.0000 mL | Freq: Once | INTRAVENOUS | Status: AC
  Administered 2021-01-20: 18:00:00 333 mL via INTRAVENOUS

## 2021-01-20 MED ORDER — WITCH HAZEL-GLYCERIN EX PADS
1.0000 "application " | MEDICATED_PAD | CUTANEOUS | Status: DC | PRN
  Administered 2021-01-20: 1 via TOPICAL
  Filled 2021-01-20: qty 100

## 2021-01-20 MED ORDER — LACTATED RINGERS IV SOLN: INTRAVENOUS | Status: DC

## 2021-01-20 MED ORDER — DIBUCAINE (PERIANAL) 1 % EX OINT
1.0000 "application " | TOPICAL_OINTMENT | CUTANEOUS | Status: DC | PRN
  Administered 2021-01-20: 1 via RECTAL
  Filled 2021-01-20: qty 28

## 2021-01-20 MED ORDER — TERBUTALINE SULFATE 1 MG/ML IJ SOLN: 0.2500 mg | Freq: Once | INTRAMUSCULAR | Status: DC | PRN

## 2021-01-20 MED ORDER — PRENATAL MULTIVITAMIN CH
1.0000 | ORAL_TABLET | Freq: Every day | ORAL | Status: DC
  Administered 2021-01-21: 14:00:00 1 via ORAL
  Filled 2021-01-20: qty 1

## 2021-01-20 MED ORDER — FENTANYL-BUPIVACAINE-NACL 0.5-0.125-0.9 MG/250ML-% EP SOLN
EPIDURAL | Status: DC | PRN
  Administered 2021-01-20: 12 mL/h via EPIDURAL

## 2021-01-20 MED ORDER — OXYTOCIN-SODIUM CHLORIDE 30-0.9 UT/500ML-% IV SOLN
INTRAVENOUS | Status: AC
  Filled 2021-01-20: qty 500

## 2021-01-20 MED ORDER — SOD CITRATE-CITRIC ACID 500-334 MG/5ML PO SOLN: 30.0000 mL | ORAL | Status: DC | PRN

## 2021-01-20 MED ORDER — BENZOCAINE-MENTHOL 20-0.5 % EX AERO
1.0000 "application " | INHALATION_SPRAY | CUTANEOUS | Status: DC | PRN
  Administered 2021-01-20: 1 via TOPICAL
  Filled 2021-01-20: qty 56

## 2021-01-20 MED ORDER — ACETAMINOPHEN 325 MG PO TABS: 650.0000 mg | ORAL_TABLET | ORAL | Status: DC | PRN

## 2021-01-20 MED ORDER — ONDANSETRON HCL 4 MG/2ML IJ SOLN: 4.0000 mg | INTRAMUSCULAR | Status: DC | PRN

## 2021-01-20 MED ORDER — ZOLPIDEM TARTRATE 5 MG PO TABS: 5.0000 mg | ORAL_TABLET | Freq: Every evening | ORAL | Status: DC | PRN

## 2021-01-20 MED ORDER — LIDOCAINE HCL (PF) 1 % IJ SOLN
INTRAMUSCULAR | Status: DC | PRN
  Administered 2021-01-20: 3 mL via SUBCUTANEOUS

## 2021-01-20 MED ORDER — DIPHENHYDRAMINE HCL 25 MG PO CAPS: 25.0000 mg | ORAL_CAPSULE | Freq: Four times a day (QID) | ORAL | Status: DC | PRN

## 2021-01-20 MED ORDER — FENTANYL-BUPIVACAINE-NACL 0.5-0.125-0.9 MG/250ML-% EP SOLN
EPIDURAL | Status: AC
  Filled 2021-01-20: qty 250

## 2021-01-20 MED ORDER — OXYTOCIN-SODIUM CHLORIDE 30-0.9 UT/500ML-% IV SOLN
INTRAVENOUS | Status: AC
  Administered 2021-01-20: 15:00:00 2 m[IU]/min via INTRAVENOUS
  Filled 2021-01-20: qty 500

## 2021-01-20 MED ORDER — OXYTOCIN 10 UNIT/ML IJ SOLN
INTRAMUSCULAR | Status: AC
  Filled 2021-01-20: qty 2

## 2021-01-20 MED ORDER — ONDANSETRON HCL 4 MG PO TABS: 4.0000 mg | ORAL_TABLET | ORAL | Status: DC | PRN

## 2021-01-20 MED ORDER — AMMONIA AROMATIC IN INHA
RESPIRATORY_TRACT | Status: AC
  Filled 2021-01-20: qty 10

## 2021-01-20 MED ORDER — SIMETHICONE 80 MG PO CHEW: 80.0000 mg | CHEWABLE_TABLET | ORAL | Status: DC | PRN

## 2021-01-20 MED ORDER — LACTATED RINGERS IV SOLN
500.0000 mL | INTRAVENOUS | Status: DC | PRN
  Administered 2021-01-20: 08:00:00 500 mL via INTRAVENOUS

## 2021-01-20 MED ORDER — OXYTOCIN-SODIUM CHLORIDE 30-0.9 UT/500ML-% IV SOLN: 1.0000 m[IU]/min | INTRAVENOUS | Status: DC

## 2021-01-20 MED ORDER — ACETAMINOPHEN 500 MG PO TABS
1000.0000 mg | ORAL_TABLET | Freq: Four times a day (QID) | ORAL | Status: DC
  Administered 2021-01-20 – 2021-01-21 (×4): 1000 mg via ORAL
  Filled 2021-01-20 (×4): qty 2

## 2021-01-20 MED ORDER — OXYTOCIN-SODIUM CHLORIDE 30-0.9 UT/500ML-% IV SOLN: 2.5000 [IU]/h | INTRAVENOUS | Status: DC

## 2021-01-20 NOTE — H&P (Signed)
Kristina Frost is a 20 y.o. female presenting for induction of labor secondary to IUGR at 37 weeks 1 day gestation.. OB History     Gravida  2   Para  1   Term  1   Preterm  0   AB  0   Living  1      SAB  0   IAB  0   Ectopic  0   Multiple  0   Live Births  1          Past Medical History:  Diagnosis Date   Gestational hypertension affecting first pregnancy 04/05/2019   Supervision of normal first pregnancy, antepartum 08/25/2018   Clinic [facility] Prenatal Labs Dating By 6w u/s at [facility] Blood type: O/Positive/-- (08/14 1455)  Genetic Screen 1 Screen:    AFP:     Quad:     NIPS: Antibody:Negative (08/14 1455) Anatomic [place]  Rubella: 6.06 (08/14 1455) Varicella: @VZVIGG @ GTT Early: NA Third trimester: 84 RPR: Reactive (08/14 1455)  Rhogam  HBsAg: Negative (08/14 1455)  TDaP vaccine  Flu Shot: HIV: Non Reactive (08/14 1455)  Baby   Syphilis, 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 Social History:  reports that she has never smoked. She has never used smokeless tobacco. She reports that she does not drink alcohol and does not use drugs.     Maternal Diabetes: No Genetic Screening: Normal Maternal Ultrasounds/Referrals: IUGR Fetal Ultrasounds or other Referrals:  Referred to Materal Fetal Medicine  Maternal Substance Abuse:  No Significant Maternal Medications:  None Significant Maternal Lab Results:  Group B Strep negative Other Comments:  None  Review of Systems  Constitutional:  Negative.   HENT: Negative.    Eyes: Negative.   Respiratory: Negative.    Cardiovascular: Negative.   Gastrointestinal:        Gravid abdomen  Endocrine: Negative.   Genitourinary: Negative.   Musculoskeletal: Negative.   Allergic/Immunologic: Negative.   Neurological: Negative.   Hematological: Negative.   Psychiatric/Behavioral: Negative.    History Dilation: 3 Effacement (%): 50 Station: -2 Exam by:: M.Sabriel Borromeo,CNM Blood pressure 119/70, pulse 92, temperature 98.6 F (37 C), temperature source Oral, resp. rate 18, height 5\' 4"  (1.626 m), weight 72.6 kg, last menstrual period 05/05/2020, unknown if currently breastfeeding. Maternal Exam:  Uterine Assessment: Contraction frequency is rare.  Abdomen: Fetal presentation: vertex Introitus: Normal vulva. Normal vagina.  Pelvis: adequate for delivery.   Cervix: Cervix evaluated by digital exam.    Physical Exam Constitutional:      Appearance: Normal appearance.  HENT:     Head: Normocephalic and atraumatic.  Cardiovascular:     Rate and Rhythm: Normal rate and regular rhythm.     Pulses: Normal pulses.     Heart sounds: Normal heart sounds.  Pulmonary:     Effort: Pulmonary effort is normal.     Breath sounds: Normal breath sounds.  Abdominal:  Palpations: Abdomen is soft.     Comments: gravid  Genitourinary:    General: Normal vulva.  Musculoskeletal:        General: Normal range of motion.     Cervical back: Normal range of motion and neck supple.  Skin:    General: Skin is warm and dry.  Neurological:     General: No focal deficit present.     Mental Status: She is alert and oriented to person, place, and time.  Psychiatric:        Mood and Affect: Mood normal.        Behavior: Behavior normal.    Prenatal labs: ABO, Rh: --/--/PENDING (12/20 0830) Antibody: PENDING (12/20 0830) Rubella: 6.85 (06/24 1412) RPR: Reactive (10/20 1105)  HBsAg: Negative (06/24 1412)  HIV: Non Reactive (10/20 1105)  GBS:  Negative/-- (12/16 1611)   Assessment/Plan: IUP 37 weeks 1 day IOL for IUGR Favorable cervix. Bishop score 5.  Routine admission orders. IV access. Regular diet until pitocin in use. EFM continuous. She plans epidural. Cytotec 25 mcg dose placed vaginally. Re evaluate in 4 hours or PRN.Likely start pitocin once bishop score is 6.   [name] 01/20/2021, 9:42 AM

## 2021-01-20 NOTE — Anesthesia Procedure Notes (Signed)
Epidural Patient location during procedure: OB Start time: 01/20/2021 4:15 PM End time: 01/20/2021 4:22 PM  Staffing Anesthesiologist: Lenard Simmer, MD Resident/CRNA: Omer Jack, CRNA Performed: resident/CRNA   Preanesthetic Checklist Completed: patient identified, IV checked, site marked, risks and benefits discussed, surgical consent, monitors and equipment checked, pre-op evaluation and timeout performed  Epidural Patient position: sitting Prep: ChloraPrep Patient monitoring: heart rate, continuous pulse ox and blood pressure Approach: midline Location: L3-L4 Injection technique: LOR air  Needle:  Needle type: Tuohy  Needle gauge: 17 G Needle length: 9 cm and 9 Needle insertion depth: 7 cm Catheter type: closed end flexible Catheter size: 19 Gauge Catheter at skin depth: 12 cm Test dose: negative and 1.5% lidocaine with Epi 1:200 K  Assessment Sensory level: T10 Events: blood not aspirated, injection not painful, no injection resistance, no paresthesia and negative IV test  Additional Notes 1 attempt Pt. Evaluated and documentation done after procedure finished. Patient identified. Risks/Benefits/Options discussed with patient including but not limited to bleeding, infection, nerve damage, paralysis, failed block, incomplete pain control, headache, blood pressure changes, nausea, vomiting, reactions to medication both or allergic, itching and postpartum back pain. Confirmed with bedside nurse the patient's most recent platelet count. Confirmed with patient that they are not currently taking any anticoagulation, have any bleeding history or any family history of bleeding disorders. Patient expressed understanding and wished to proceed. All questions were answered. Sterile technique was used throughout the entire procedure. Please see nursing notes for vital signs. Test dose was given through epidural catheter and negative prior to continuing to dose epidural or start  infusion. Warning signs of high block given to the patient including shortness of breath, tingling/numbness in hands, complete motor block, or any concerning symptoms with instructions to call for help. Patient was given instructions on fall risk and not to get out of bed. All questions and concerns addressed with instructions to call with any issues or inadequate analgesia.    Patient tolerated the insertion well without immediate complications.Reason for block:procedure for pain

## 2021-01-20 NOTE — Anesthesia Preprocedure Evaluation (Signed)
Anesthesia Evaluation  Patient identified by MRN, date of birth, ID band Patient awake    Reviewed: Allergy & Precautions, H&P , NPO status   Airway Mallampati: III  TM Distance: >3 FB Neck ROM: full    Dental  (+) Chipped   Pulmonary    Pulmonary exam normal        Cardiovascular hypertension (PreE with previous pregnancy), Normal cardiovascular exam     Neuro/Psych negative neurological ROS     GI/Hepatic negative GI ROS, Neg liver ROS,   Endo/Other  negative endocrine ROS  Renal/GU negative Renal ROS  negative genitourinary   Musculoskeletal   Abdominal   Peds  Hematology negative hematology ROS (+)   Anesthesia Other Findings   Reproductive/Obstetrics (+) Pregnancy                             Anesthesia Physical Anesthesia Plan  ASA: 2  Anesthesia Plan: Epidural   Post-op Pain Management:    Induction:   PONV Risk Score and Plan:   Airway Management Planned:   Additional Equipment:   Intra-op Plan:   Post-operative Plan:   Informed Consent: I have reviewed the patients History and Physical, chart, labs and discussed the procedure including the risks, benefits and alternatives for the proposed anesthesia with the patient or authorized representative who has indicated his/her understanding and acceptance.     Dental Advisory Given  Plan Discussed with: Anesthesiologist and CRNA  Anesthesia Plan Comments:         Anesthesia Quick Evaluation

## 2021-01-20 NOTE — Progress Notes (Signed)
Pt requested pacifier for baby. RN educated pt about pacifier use during breastfeeding. Pt verbalized understanding. RN provided pacifier.

## 2021-01-20 NOTE — Discharge Summary (Signed)
Postpartum Discharge Summary  Date of Service updated 01/21/2021    Patient Name: Kristina Frost DOB: 07/29/2000 MRN: [id number]  Date of admission: 01/20/2021 Delivery date:01/20/2021  Delivering provider: Adrian Prows R  Date of discharge: 01/21/2021  Admitting diagnosis: IUGR (intrauterine growth restriction) affecting care of mother [O36.5990] Intrauterine pregnancy: [redacted]w[redacted]d    Secondary diagnosis:  Principal Problem:   IUGR (intrauterine growth restriction) affecting care of mother Active Problems:   Postpartum care following vaginal delivery  Additional problems: none    Discharge diagnosis: Term Pregnancy Delivered                                              Post partum procedures: none Augmentation: Pitocin and Cytotec Complications: None  Hospital course: Induction of Labor With Vaginal Delivery   20y.o. yo G2P1001 at 348w1das admitted to the hospital 01/20/2021 for induction of labor.  Indication for induction:  IUGR .  Patient had an uncomplicated labor course as follows: Membrane Rupture Time/Date:  ,   Delivery Method:Vaginal, Spontaneous  Episiotomy: None  Lacerations:  None  Details of delivery can be found in separate delivery note.  Patient had a routine postpartum course. Patient is discharged home 01/21/21.  Newborn Data: Birth date:01/20/2021  Birth time:5:41 PM  Gender:Female  Living status:Living  Apgars:8 ,9  Weight:2680 g   Magnesium Sulfate received: No BMZ received: No Rhophylac:No MMR:No T-DaP:Given prenatally Flu: No Transfusion:No  Physical exam  Vitals:   01/20/21 2303 01/21/21 0325 01/21/21 0633 01/21/21 0737  BP: 108/64 108/68  109/74  Pulse: 79 79  65  Resp: 16 18    Temp: 98.5 F (36.9 C)  98 F (36.7 C) 98.3 F (36.8 C)  TempSrc: Oral  Oral Oral  SpO2: 100% 100%  100%  Weight:      Height:       General: alert, cooperative, and no distress Lochia: appropriate Uterine Fundus: firm Incision: N/A DVT  Evaluation: No evidence of DVT seen on physical exam. Negative Homan's sign. Labs: Lab Results  Component Value Date   WBC 9.7 01/21/2021   HGB 8.2 (L) 01/21/2021   HCT 25.5 (L) 01/21/2021   MCV 82.8 01/21/2021   PLT 210 01/21/2021   CMP Latest Ref Rng & Units 04/05/2019  Glucose 70 - 99 mg/dL 77  BUN 6 - 20 mg/dL 16  Creatinine 0.44 - 1.00 mg/dL 0.83  Sodium 135 - 145 mmol/L 135  Potassium 3.5 - 5.1 mmol/L 3.8  Chloride 98 - 111 mmol/L 106  CO2 22 - 32 mmol/L 23  Calcium 8.9 - 10.3 mg/dL 8.3(L)  Total Protein 6.5 - 8.1 g/dL 6.6  Total Bilirubin 0.3 - 1.2 mg/dL 0.9  Alkaline Phos 38 - 126 U/L 245(H)  AST 15 - 41 U/L 18  ALT 0 - 44 U/L 12   Edinburgh Score: Edinburgh Postnatal Depression Scale Screening Tool 01/21/2021  I have been able to laugh and see the funny side of things. 0  I have looked forward with enjoyment to things. 0  I have blamed myself unnecessarily when things went wrong. 1  I have been anxious or worried for no good reason. 1  I have felt scared or panicky for no good reason. 0  Things have been getting on top of me. 0  I have been so unhappy that I have had difficulty  sleeping. 0  I have felt sad or miserable. 0  I have been so unhappy that I have been crying. 0  The thought of harming myself has occurred to me. 0  Edinburgh Postnatal Depression Scale Total 2      After visit meds:  Allergies as of 01/21/2021   No Known Allergies      Medication List     TAKE these medications    ibuprofen 600 MG tablet Commonly known as: ADVIL Take 1 tablet (600 mg total) by mouth every 6 (six) hours.   prenatal vitamin w/FE, FA 29-1 MG Chew chewable tablet Chew 1 tablet by mouth daily at 12 noon.         Discharge home in stable condition Infant Feeding: Breast Infant Disposition:home with mother Discharge instruction: per After Visit Summary and Postpartum booklet. Activity: Advance as tolerated. Pelvic rest for 6 weeks.  Diet: routine  diet Anticipated Birth Control: IUD Postpartum Appointment:2 weeks Additional Postpartum F/U:  none Future Appointments:No future appointments. Follow up Visit:  Follow-up Information     Schuman, Stefanie Libel, MD. Schedule an appointment as soon as possible for a visit in 2 week(s).   Specialty: Obstetrics and Gynecology Contact information: [address] [place] [address] [phone]                     01/21/2021 Imagene Riches, CNM

## 2021-01-21 ENCOUNTER — Encounter: Payer: Self-pay | Admitting: Obstetrics and Gynecology

## 2021-01-21 LAB — RPR
RPR Ser Ql: REACTIVE — AB
RPR Titer: 1:2 {titer}

## 2021-01-21 LAB — CBC
HCT: 25.5 % — ABNORMAL LOW (ref 36.0–46.0)
Hemoglobin: 8.2 g/dL — ABNORMAL LOW (ref 12.0–15.0)
MCH: 26.6 pg (ref 26.0–34.0)
MCHC: 32.2 g/dL (ref 30.0–36.0)
MCV: 82.8 fL (ref 80.0–100.0)
Platelets: 210 10*3/uL (ref 150–400)
RBC: 3.08 MIL/uL — ABNORMAL LOW (ref 3.87–5.11)
RDW: 15.2 % (ref 11.5–15.5)
WBC: 9.7 10*3/uL (ref 4.0–10.5)
nRBC: 0 % (ref 0.0–0.2)

## 2021-01-21 MED ORDER — IBUPROFEN 600 MG PO TABS
600.0000 mg | ORAL_TABLET | Freq: Four times a day (QID) | ORAL | 0 refills | Status: DC
Start: 2021-01-21 — End: 2022-11-07

## 2021-01-21 NOTE — Progress Notes (Signed)
Pt discharged with infant. Discharge instructions, prescriptions, and follow up appointments given to and reviewed with patient. Pt verbalized understanding. To be escorted out by auxillary.  °

## 2021-01-21 NOTE — Lactation Note (Signed)
This note was copied from a baby's chart. Lactation Consultation Note  Patient Name: Kristina Frost Date: 01/21/2021 Reason for consult: Initial assessment;Early term 37-38.6wks Age:21 hours  Initial lactation visit for P2 mom who delivered vaginally 16 hours ago. Baby was born at [redacted]w[redacted]d has breastfed and had output since delivery.  Mom, support person, and baby asleep upon entry, however mom woke easily. Mom has breastfeeding history of pumping with her first for 6 months with no concerns re: supply. Mom has felt that baby is feeding well at the breast, but she does plan to initiate pumping once at home. [facility]. Reviewed early cues, feeding on demand, and tracking output. Discussed feeding patterns in 24 hours, and encouraged frequent attempts. Whiteboard updated with LC name/number, encouraged to call w/ questions/concerns or feeding support.  Maternal Data Does the patient have breastfeeding experience prior to this delivery?: Yes How long did the patient breastfeed?: 6 months  Feeding Mother's Current Feeding Choice: Breast Milk and Formula  LATCH Score                    Lactation Tools Discussed/Used Tools:  (pump kit given)  Interventions Interventions: Breast feeding basics reviewed;DEBP;Education  Discharge Pump: WIC Loaner ([employer]) WIC Program: Yes  Consult Status Consult Status: Follow-up Date: 01/21/21 Follow-up type: In-patient    Kristina Drafts12/21/2022, 11:04 AM

## 2021-01-22 LAB — T.PALLIDUM AB, TOTAL: T Pallidum Abs: REACTIVE — AB

## 2021-02-24 NOTE — Anesthesia Postprocedure Evaluation (Signed)
Anesthesia Post Note  Patient: Kristina Frost  Procedure(s) Performed: AN AD HOC LABOR EPIDURAL  Anesthesia Type: Epidural Pain management: pain level controlled Vital Signs Assessment: post-procedure vital signs reviewed and stable Respiratory status: respiratory function stable Cardiovascular status: blood pressure returned to baseline Postop Assessment: no headache, no backache and able to ambulate Anesthetic complications: no Comments: Patient was discharged from the hospital prior to being seen by the anesthesia team.  Nursing notes reviewed and patient had no complaints and was ambulating well prior to discharge.   No notable events documented.   Last Vitals: There were no vitals filed for this visit.  Last Pain: There were no vitals filed for this visit.               Lenard Simmer

## 2021-02-27 ENCOUNTER — Ambulatory Visit: Payer: Medicaid Other | Admitting: Obstetrics and Gynecology

## 2021-03-02 ENCOUNTER — Encounter: Payer: Self-pay | Admitting: Obstetrics and Gynecology

## 2021-03-02 ENCOUNTER — Other Ambulatory Visit (HOSPITAL_COMMUNITY)
Admission: RE | Admit: 2021-03-02 | Discharge: 2021-03-02 | Disposition: A | Discharge status: Home / Self Care | Payer: Medicaid Other | Source: Ambulatory Visit | Attending: Obstetrics and Gynecology | Admitting: Obstetrics and Gynecology

## 2021-03-02 ENCOUNTER — Other Ambulatory Visit: Payer: Self-pay

## 2021-03-02 ENCOUNTER — Ambulatory Visit (INDEPENDENT_AMBULATORY_CARE_PROVIDER_SITE_OTHER): Payer: Medicaid Other | Admitting: Obstetrics and Gynecology

## 2021-03-02 DIAGNOSIS — Z124 Encounter for screening for malignant neoplasm of cervix: Secondary | ICD-10-CM | POA: Insufficient documentation

## 2021-03-02 DIAGNOSIS — Z30011 Encounter for initial prescription of contraceptive pills: Secondary | ICD-10-CM

## 2021-03-02 MED ORDER — NORETHIN ACE-ETH ESTRAD-FE 1-20 MG-MCG PO TABS: 1.0000 | ORAL_TABLET | Freq: Every day | ORAL | 11 refills | Status: DC

## 2021-03-02 NOTE — Progress Notes (Signed)
Postpartum Visit  Chief Complaint:  Chief Complaint  Patient presents with   Postpartum Care    History of Present Illness: Patient is a 21 y.o. [id number] presents for postpartum visit.  Date of delivery: 01/20/2021  Type of delivery: vaginal Laceration: none   Breast Feeding:  breast and bottle Lochia: normal   Edinburgh Post-Partum Depression Score: 4  Date of last PAP: never  She reports she has been feeling well  Newborn Details:  SINGLETON :  1. Infant Status: Infant doing well at home with mother.   Review of Systems: Review of Systems  Constitutional:  Negative for chills, fever, malaise/fatigue and weight loss.  HENT:  Negative for congestion, hearing loss and sinus pain.   Eyes:  Negative for blurred vision and double vision.  Respiratory:  Negative for cough, sputum production, shortness of breath and wheezing.   Cardiovascular:  Negative for chest pain, palpitations, orthopnea and leg swelling.  Gastrointestinal:  Negative for abdominal pain, constipation, diarrhea, nausea and vomiting.  Genitourinary:  Negative for dysuria, flank pain, frequency, hematuria and urgency.  Musculoskeletal:  Negative for back pain, falls and joint pain.  Skin:  Negative for itching and rash.  Neurological:  Negative for dizziness and headaches.  Psychiatric/Behavioral:  Negative for depression, substance abuse and suicidal ideas. The patient is not nervous/anxious.    Past Medical History:  Past Medical History:  Diagnosis Date   Gestational hypertension affecting first pregnancy 04/05/2019   Intrauterine growth restriction (IUGR) affecting care of mother, third trimester, single gestation 01/09/2021   IUGR at 57 weeks. Growth scans q 3-4 weeks BPP/dopplers at diagnosis and weekly Plan for delivery at 38-39 weeks, sooner for abnormal dopplers   Supervision of normal first pregnancy, antepartum 08/25/2018   Clinic [facility] Prenatal Labs Dating By 6w u/s at [facility] Blood type:  O/Positive/-- (08/14 1455)  Genetic Screen 1 Screen:    AFP:     Quad:     NIPS: Antibody:Negative (08/14 1455) Anatomic [place]  Rubella: 6.06 (08/14 1455) Varicella: @VZVIGG @ GTT Early: NA Third trimester: 84 RPR: Reactive (08/14 1455)  Rhogam  HBsAg: Negative (08/14 1455)  TDaP vaccine  Flu Shot: HIV: Non Reactive (08/14 1455)  Baby   Syphilis, 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 No pertinent family history.  Social History:  Social History   Socioeconomic History   Marital status: Significant Other    Spouse name: Doctor, hospital   Number of children: Not on file   Years of education: Not on file   Highest education level: Not on file  Occupational History   Not on file  Tobacco Use   Smoking status: Never   Smokeless tobacco: Never  Vaping Use   Vaping Use: Never used  Substance and Sexual Activity   Alcohol use: Never   Drug use: Never   Sexual activity: Yes    Birth control/protection: I.U.D.  Other Topics  Concern   Not on file  Social History Narrative   Not on file   Social Determinants of Health   Financial Resource Strain: Not on file  Food Insecurity: Not on file  Transportation Needs: Not on file  Physical Activity: Not on file  Stress: Not on file  Social Connections: Not on file  Intimate Partner Violence: Not on file    Allergies:  No Known Allergies  Medications: Prior to Admission medications   Medication Sig Start Date End Date Taking? Authorizing  Provider  norethindrone-ethinyl estradiol-FE (JUNEL FE 1/20) 1-20 MG-MCG tablet Take 1 tablet by mouth daily. 03/02/21  Yes Nathalee Smarr R, MD  ibuprofen (ADVIL) 600 MG tablet Take 1 tablet (600 mg total) by mouth every 6 (six) hours. Patient not taking: Reported on 03/02/2021 01/21/21   Imagene Riches, CNM  prenatal vitamin w/FE, FA (NATACHEW) 29-1 MG CHEW chewable tablet Chew 1 tablet by mouth daily at 12 noon. Patient not taking: Reported on 03/02/2021    [provider]    Physical Exam Vitals:  Vitals:   03/02/21 1353  BP: 120/70    Physical Exam Constitutional:      Appearance: She is well-developed.  Genitourinary:     Genitourinary Comments: External: Normal appearing vulva. No lesions noted.  Speculum examination: Normal appearing cervix. No blood in the vaginal vault. No discharge.     HENT:     Head: Normocephalic and atraumatic.  Neck:     Thyroid: No thyromegaly.  Cardiovascular:     Rate and Rhythm: Normal rate and regular rhythm.     Heart sounds: Normal heart sounds.  Pulmonary:     Effort: Pulmonary effort is normal.     Breath sounds: Normal breath sounds.  Abdominal:     General: Bowel sounds are normal. There is no distension.     Palpations: Abdomen is soft. There is no mass.  Musculoskeletal:     Cervical back: Neck supple.  Neurological:     Mental Status: She is alert and oriented to person, place, and time.  Skin:    General: Skin is warm and dry.  Psychiatric:        Behavior: Behavior normal.        Thought Content: Thought content normal.        Judgment: Judgment normal.  Vitals reviewed.    Assessment: 21 y.o. [id number] presenting for 6 week postpartum visit  Plan: Problem List Items Addressed This Visit       Other   Postpartum care following vaginal delivery - Primary   Other Visit Diagnoses     Cervical cancer screening       Relevant Orders   Cytology - PAP   Encounter for BCP (birth control pills) initial  prescription       Relevant Medications   norethindrone-ethinyl estradiol-FE (JUNEL FE 1/20) 1-20 MG-MCG tablet       1) Contraception-  desires to start OCP- rx sent  2)  Pap: performed today  3) Patient underwent screening for postpartum depression with no concerns noted.  - Follow up in 1 year   Adrian Prows MD, [place], [facility] 03/02/2021 2:19 PM

## 2021-03-05 LAB — CYTOLOGY - PAP
Chlamydia: NEGATIVE
Comment: NEGATIVE
Comment: NEGATIVE
Comment: NORMAL
Diagnosis: NEGATIVE
Neisseria Gonorrhea: NEGATIVE
Trichomonas: NEGATIVE

## 2021-03-30 ENCOUNTER — Other Ambulatory Visit: Payer: Self-pay

## 2021-03-30 NOTE — Telephone Encounter (Signed)
Pt calling for refill of bc; ran out yesterday.  [phone]  Left detailed msg for pt to contact her pharmacy - has refills there.

## 2021-11-27 IMAGING — US US OB COMP +14 WK
1 series · 15 of 28 positions shown · non-contrast
Comparison: none

CLINICAL DATA: Second trimester pregnancy for fetal anatomy survey.

EXAM:
OBSTETRICAL ULTRASOUND >14 WKS

[Series 1: us ob comp + 14 wk · 15 of 84 slices shown]
[im 1/84]
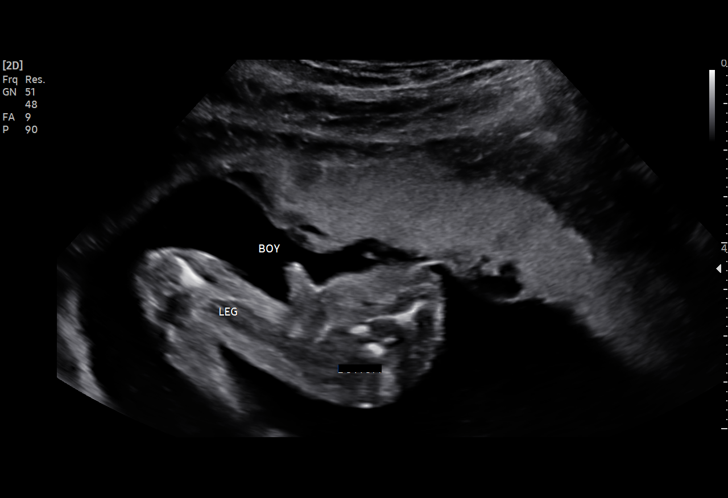
[im 7/84]
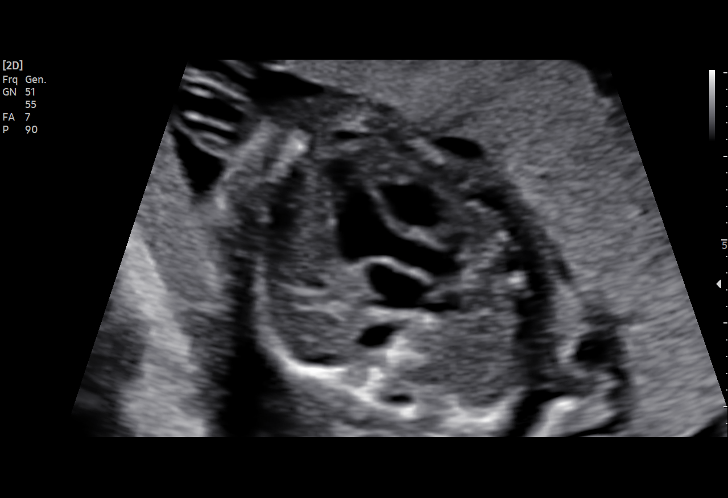
[im 13/84]
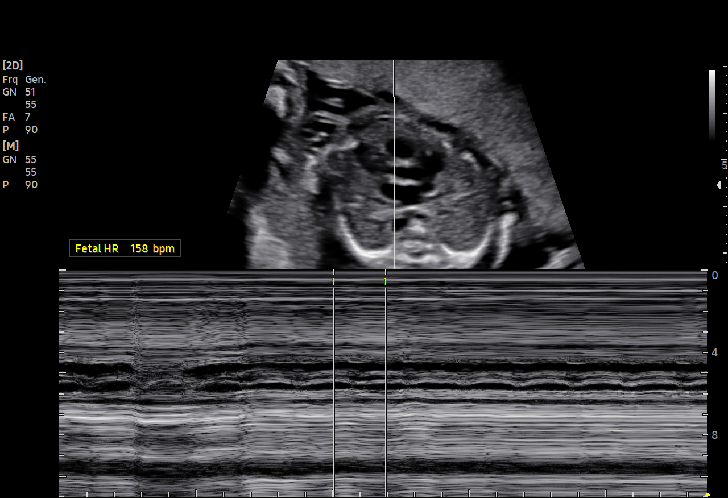
[im 19/84]
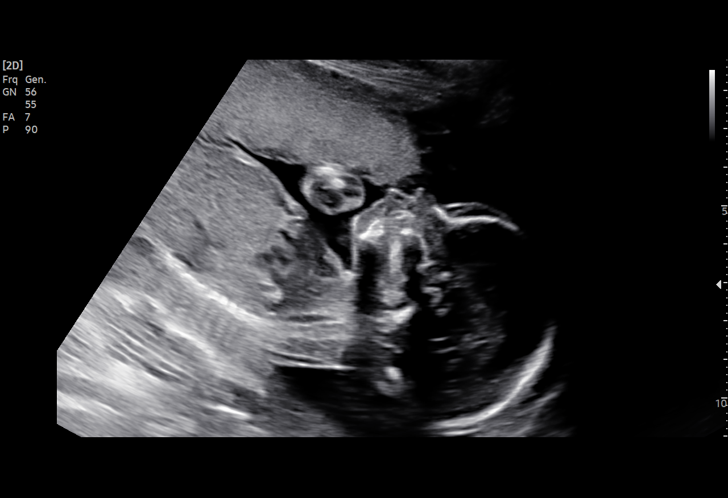
[im 25/84]
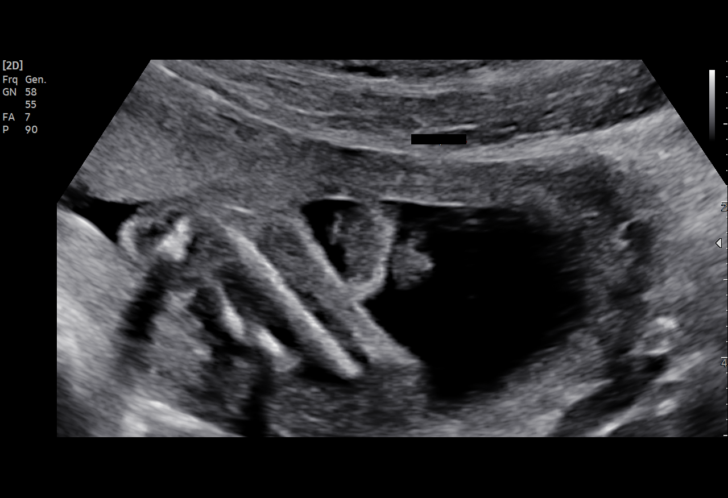
[im 31/84]
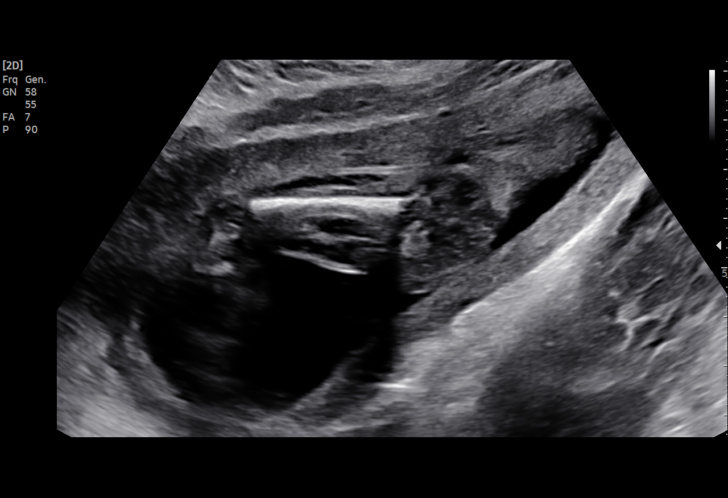
[im 37/84]
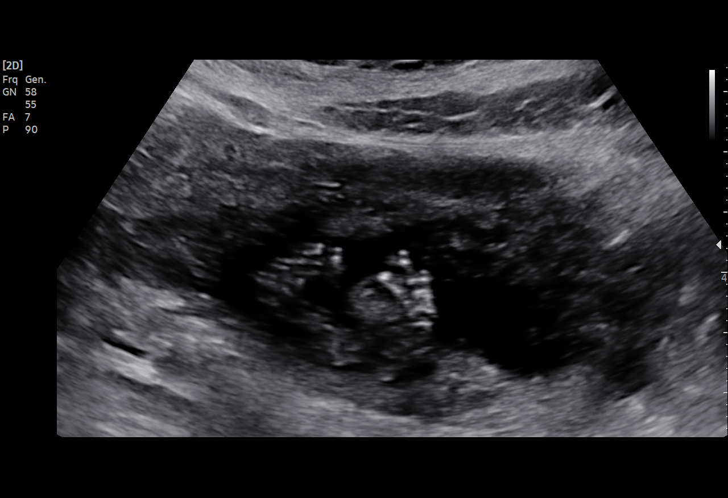
[im 44/84]
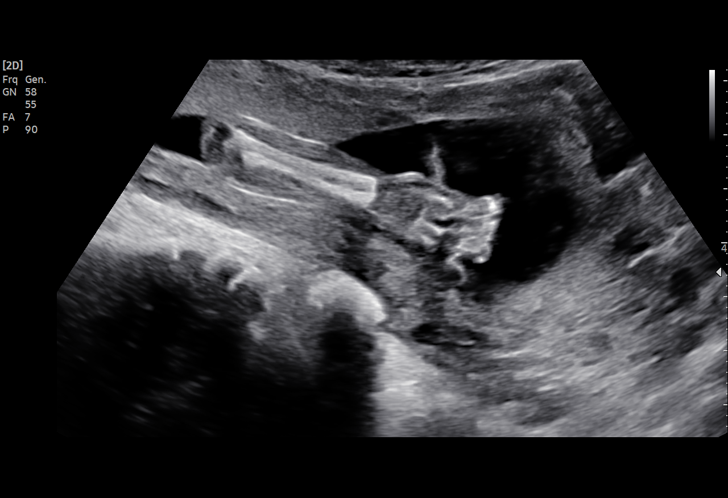
[im 47/84]
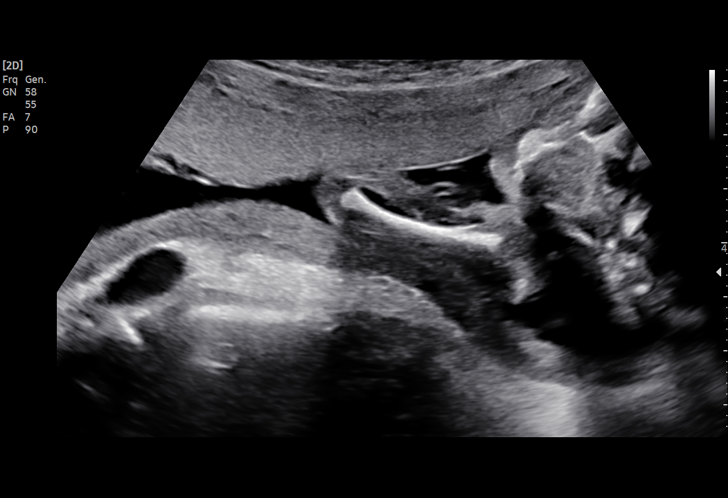
[im 53/84]
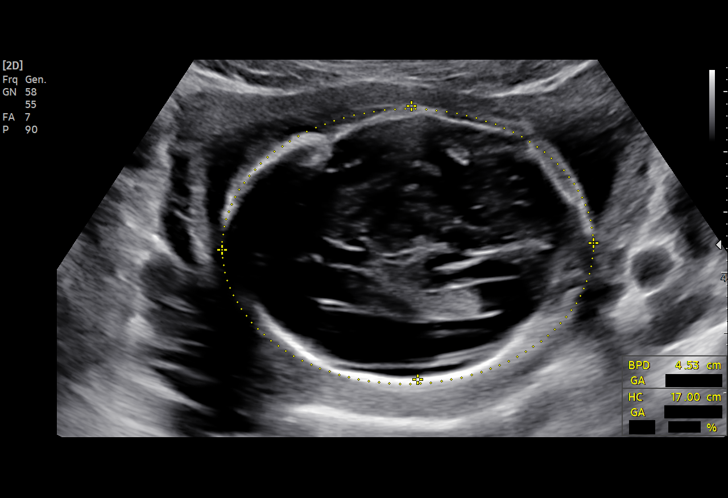
[im 59/84]
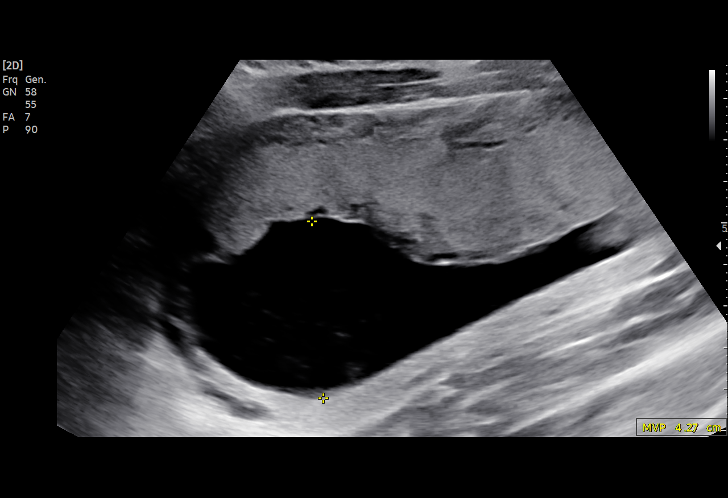
[im 65/84]
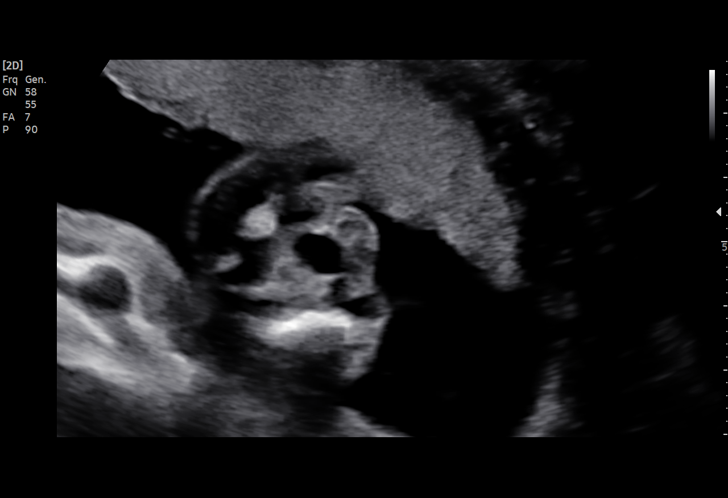
[im 71/84]
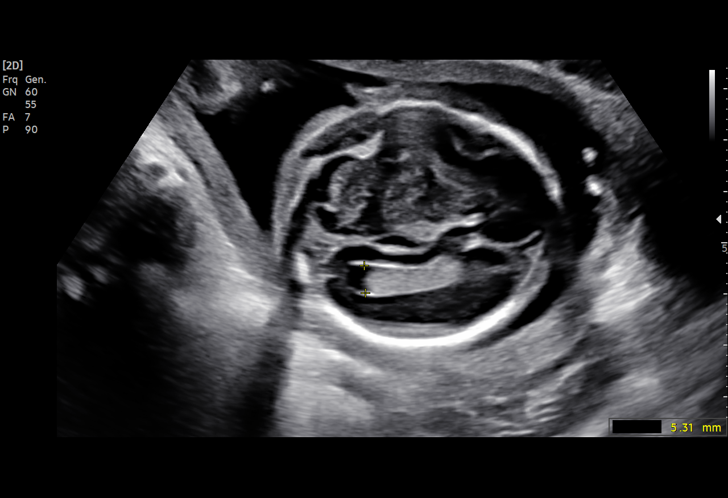
[im 77/84]
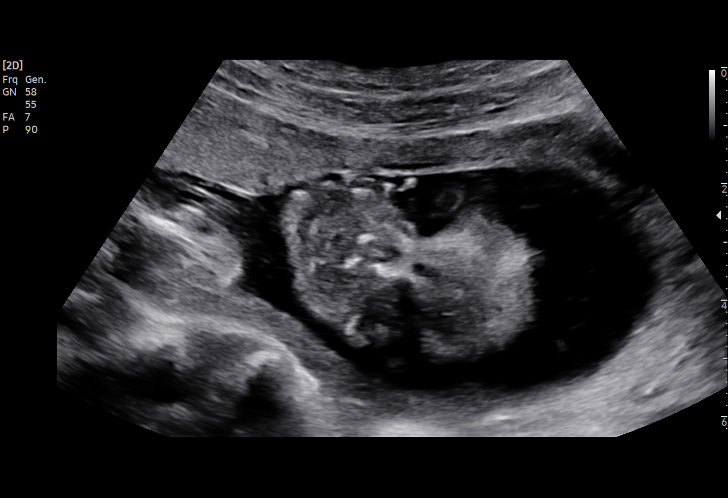
[im 84/84]
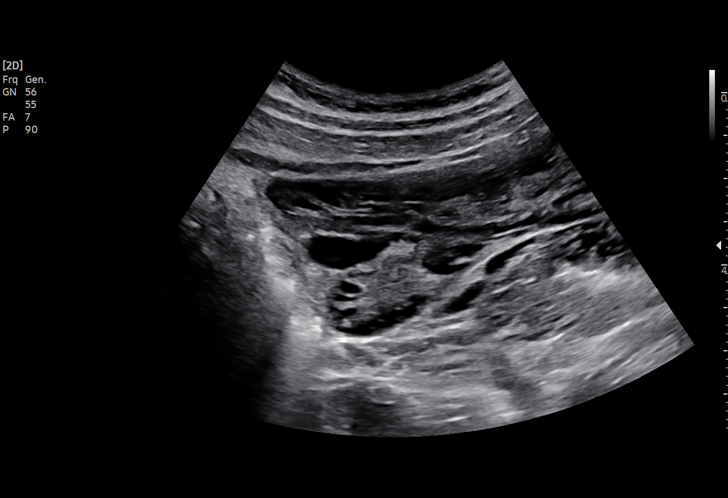

[15 of 28 positions shown; findings below may reference images not displayed]

FINDINGS: Number of Fetuses: 1

Heart Rate:  158 bpm

Movement: Yes

Presentation: Cephalic

Previa: No

Placental Location: Anterior

Amniotic Fluid (Subjective): Within normal limits

Amniotic Fluid (Objective):

Vertical pocket = 4.3cm

FETAL BIOMETRY

BPD: 4.4cm 19w 2d

HC:   17.0cm 19w 4d

AC:   13.5cm 19w 0d

FL:   3.1cm 19w 4d

Current Mean GA: 19w 3d US EDC: 02/10/2021

Assigned GA:  19w 4d Assigned EDC: 02/09/2021

FETAL ANATOMY

Lateral Ventricles: Not visualized

Thalami/CSP: Not visualized

Posterior Fossa:  Not visualized

Nuchal Region: Not visualized   NFT= Not Visualized.

Upper Lip: Not visualized

Spine: Not well visualized

4 Chamber Heart on Left: Appears normal

LVOT: Appears normal

RVOT: Appears normal

Stomach on Left: Appears normal

3 Vessel Cord: Appears normal

Cord Insertion site: Appears normal

Kidneys: Appears normal

Bladder: Appears normal

Extremities: Appears normal

Sex: Male

Technically difficult due to: Fetal position

Maternal Findings:

Cervix:  3.4 cm TA
IMPRESSION: Assigned GA currently 19 weeks 4 days.  Appropriate fetal growth.

No fetal anomalies identified, although fetal brain, upper lip, and
spine could not be visualized due to fetal position. Recommend
further evaluation with Ob follow-up ultrasound in 3-4 weeks.

## 2022-03-19 IMAGING — US US OB FOLLOW-UP
1 series · 13 of 28 positions shown · non-contrast
Comparison: none

CLINICAL DATA: growth

EXAM:
OBSTETRIC 14+ WK ULTRASOUND FOLLOW-UP

[Series 1: us ob follow-up · 0.26mm/px · 13 of 45 slices shown]
[im 2/45]
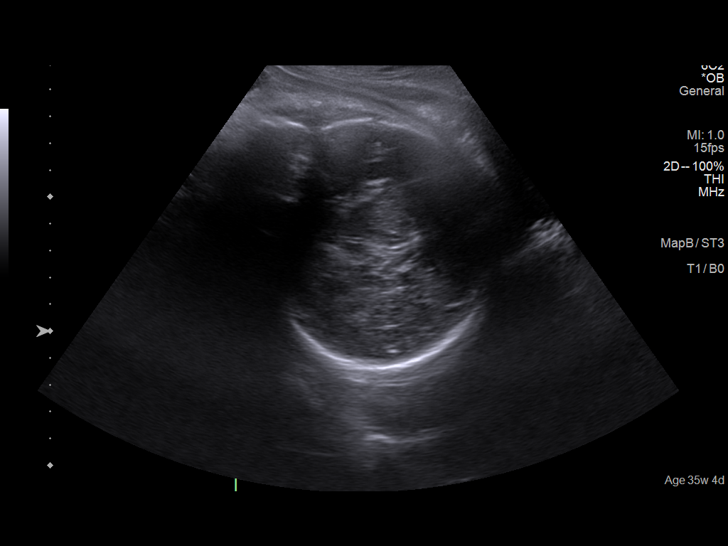
[im 5/45]
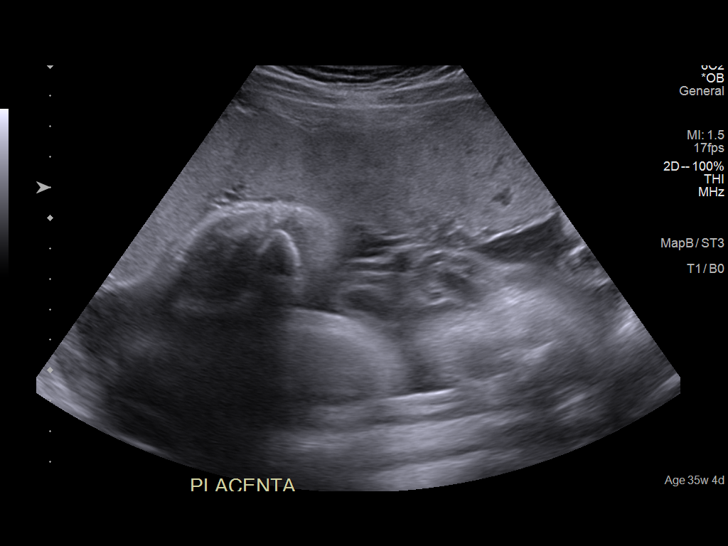
[im 9/45]
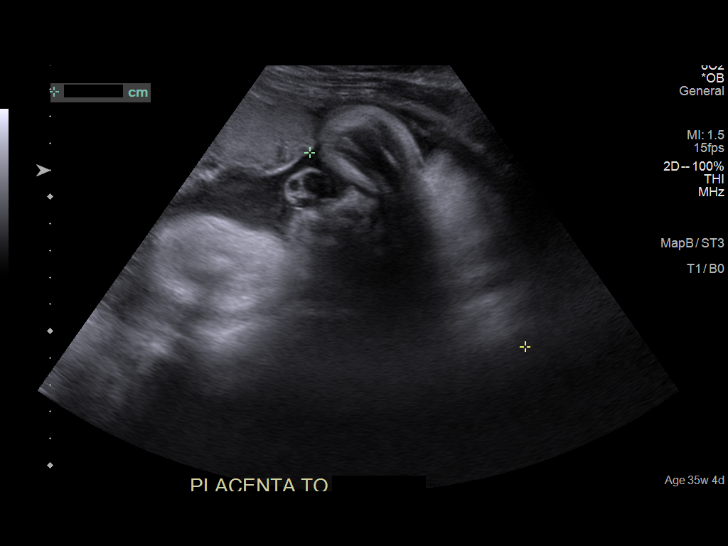
[im 12/45]
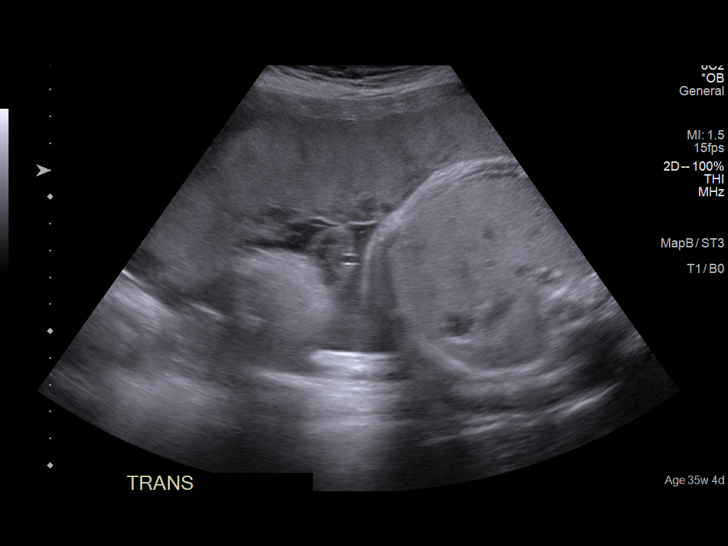
[im 15/45]
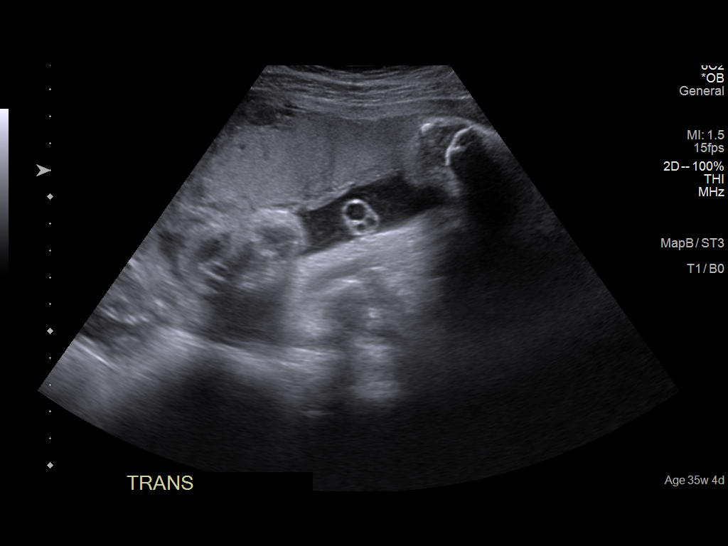
[im 18/45]
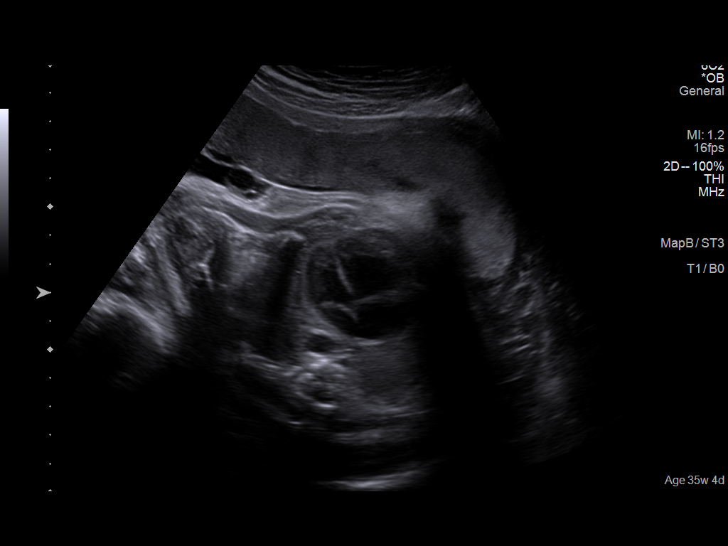
[im 23/45]
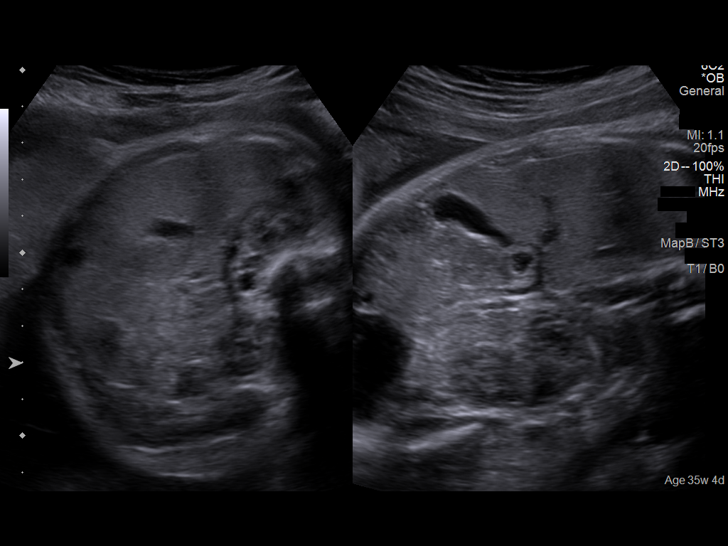
[im 27/45]
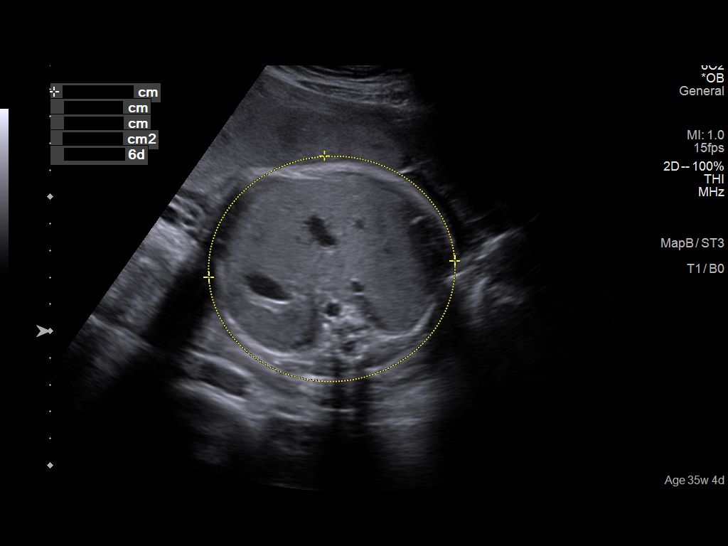
[im 30/45]
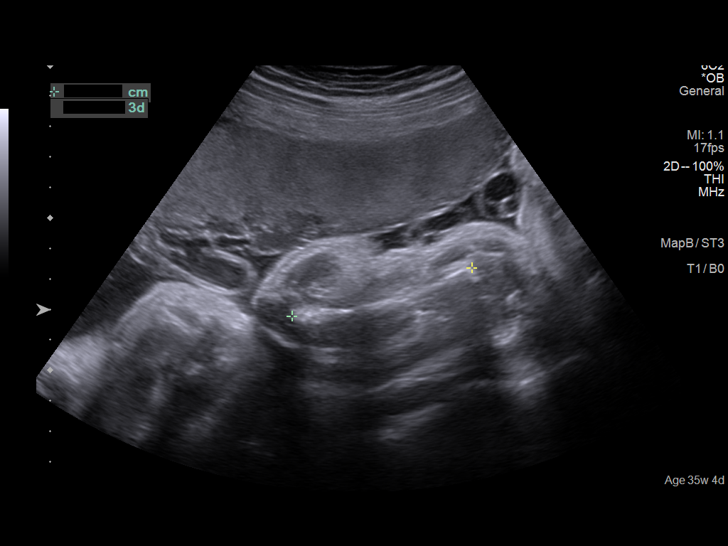
[im 33/45]
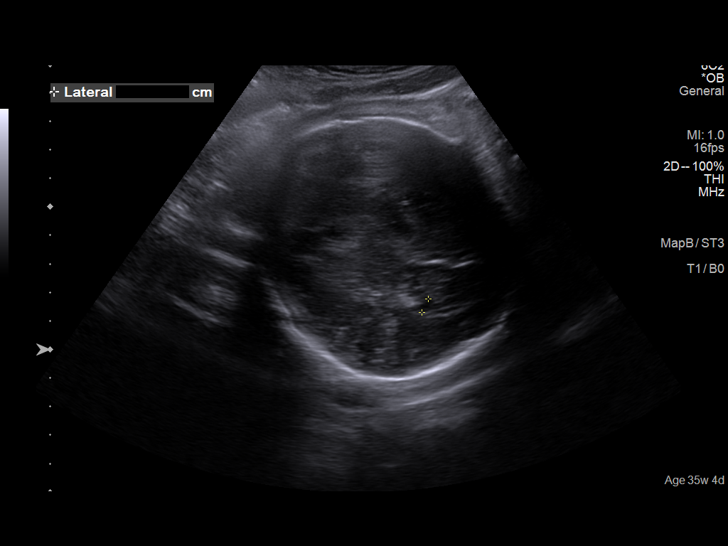
[im 36/45]
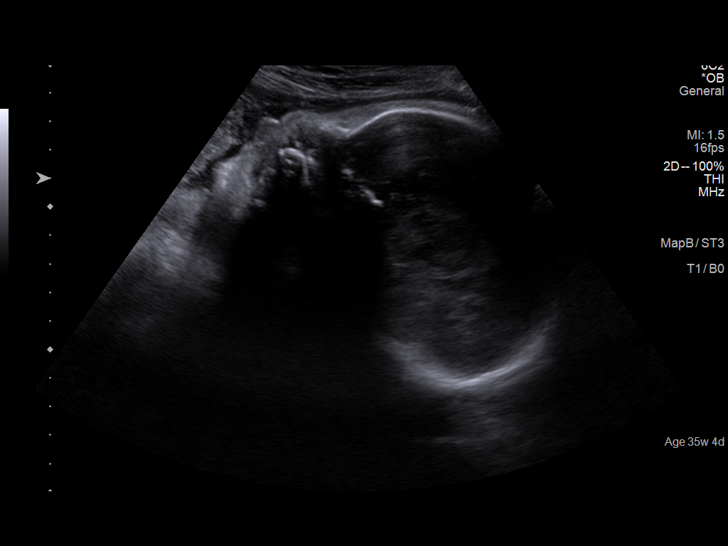
[im 40/45]
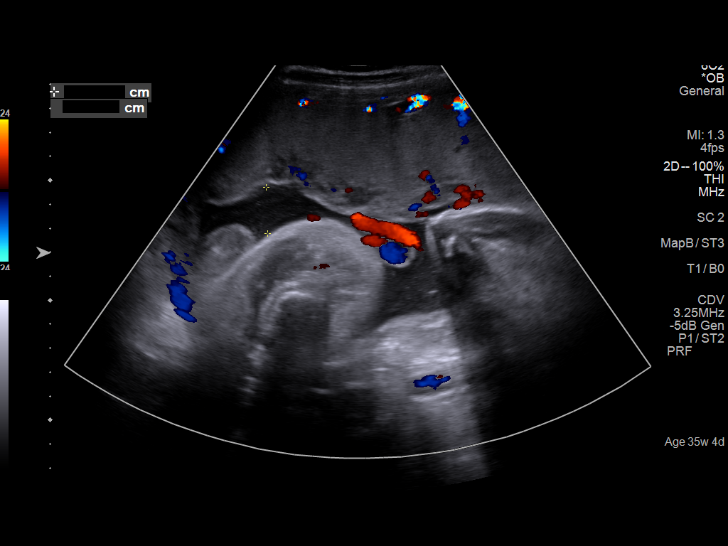
[im 43/45]
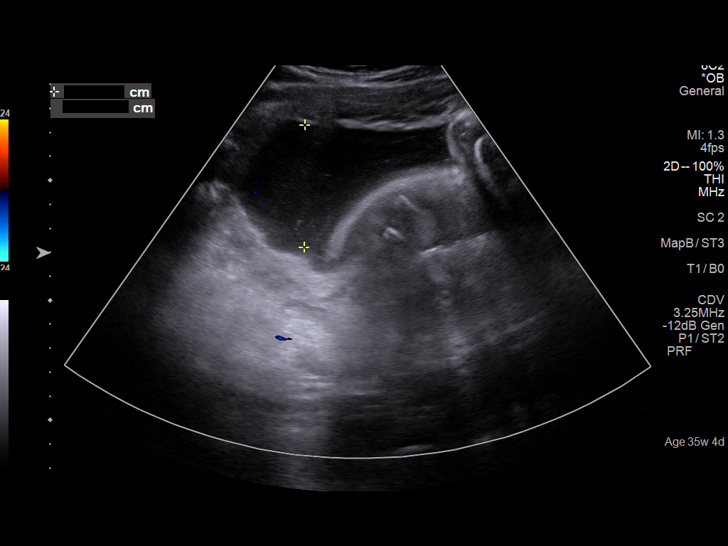

[13 of 28 positions shown; findings below may reference images not displayed]

FINDINGS: Number of Fetuses: 1

Heart Rate:  126 bpm

Movement: Yes

Presentation: Cephalic

Previa: No

Placental Location: Anterior

Amniotic Fluid (Subjective): Normal

Amniotic Fluid (Objective):

AFI 12.3 cm (5%ile= 7.9 cm, 95%= 24.9 cm for 35 wks)

FETAL BIOMETRY

BPD:  8.5cm 34w 2d

HC:    30.8cm 34w 2d

AC:    28.3cm 32w 2d

FL:    6.7cm 34w 5d

Current Mean GA: 34w 1d US EDC: 02/19/2021

Assigned GA: 35w 4d Assigned EDC: 02/09/2021

Estimated Fetal Weight:  2,185g 6%ile, previously 38 %ile

FETAL ANATOMY

Lateral Ventricles: Previously seen

Thalami/CSP: Previously seen

Posterior Fossa: Previously seen

Nuchal Region: Previously seen

Upper Lip: Previously seen

Spine: Previously seen

4 Chamber Heart on Left: Previously seen

LVOT: Previously seen

RVOT: Previously seen

Stomach on Left: Appears normal

3 Vessel Cord: Previously seen

Cord Insertion site: Previously seen

Kidneys: Appears normal

Bladder: Appears normal

Extremities: Previously seen

Sex: Previously Seen

Technical Limitations: Fetal position and late gestational age

Maternal Findings:

Cervix:  Not evaluated.
IMPRESSION: 1. Single live intrauterine pregnancy as above, estimated age 34
weeks and 1 day. The assigned gestational age is 35 weeks and 4
days. There is less than expected interval fetal growth as well,
with estimated fetal weight now at the sixth percentile, previously
at the thirty-eighth percentile.

## 2022-11-06 ENCOUNTER — Other Ambulatory Visit: Payer: Self-pay

## 2022-11-06 ENCOUNTER — Observation Stay
Admission: EM | Admit: 2022-11-06 | Discharge: 2022-11-07 | Disposition: A | Discharge status: Still In Hospital | Payer: Medicaid Other | Attending: Obstetrics and Gynecology | Admitting: Obstetrics and Gynecology

## 2022-11-06 ENCOUNTER — Encounter: Payer: Self-pay | Admitting: Emergency Medicine

## 2022-11-06 DIAGNOSIS — O219 Vomiting of pregnancy, unspecified: Secondary | ICD-10-CM | POA: Diagnosis not present

## 2022-11-06 DIAGNOSIS — O26899 Other specified pregnancy related conditions, unspecified trimester: Principal | ICD-10-CM | POA: Diagnosis present

## 2022-11-06 DIAGNOSIS — O26893 Other specified pregnancy related conditions, third trimester: Principal | ICD-10-CM | POA: Insufficient documentation

## 2022-11-06 DIAGNOSIS — Z3A31 31 weeks gestation of pregnancy: Secondary | ICD-10-CM | POA: Insufficient documentation

## 2022-11-06 DIAGNOSIS — R109 Unspecified abdominal pain: Secondary | ICD-10-CM | POA: Insufficient documentation

## 2022-11-06 DIAGNOSIS — O368330 Maternal care for abnormalities of the fetal heart rate or rhythm, third trimester, not applicable or unspecified: Secondary | ICD-10-CM | POA: Insufficient documentation

## 2022-11-06 DIAGNOSIS — O30033 Twin pregnancy, monochorionic/diamniotic, third trimester: Secondary | ICD-10-CM

## 2022-11-06 LAB — URINALYSIS, ROUTINE W REFLEX MICROSCOPIC
Bilirubin Urine: NEGATIVE
Glucose, UA: NEGATIVE mg/dL
Hgb urine dipstick: NEGATIVE
Ketones, ur: 5 mg/dL — AB
Nitrite: NEGATIVE
Protein, ur: 30 mg/dL — AB
Specific Gravity, Urine: 1.023 (ref 1.005–1.030)
pH: 6 (ref 5.0–8.0)

## 2022-11-06 MED ORDER — FAMOTIDINE 20 MG PO TABS
20.0000 mg | ORAL_TABLET | Freq: Once | ORAL | Status: AC
  Administered 2022-11-06: 20 mg via ORAL
  Filled 2022-11-06: qty 1

## 2022-11-06 MED ORDER — SOD CITRATE-CITRIC ACID 500-334 MG/5ML PO SOLN
ORAL | Status: AC
  Filled 2022-11-06: qty 15

## 2022-11-06 MED ORDER — FAMOTIDINE 10 MG PO TABS
10.0000 mg | ORAL_TABLET | Freq: Two times a day (BID) | ORAL | 2 refills | Status: AC
Start: 2022-11-06 — End: ?

## 2022-11-06 MED ORDER — SOD CITRATE-CITRIC ACID 500-334 MG/5ML PO SOLN
30.0000 mL | Freq: Once | ORAL | Status: AC
  Administered 2022-11-06: 30 mL via ORAL

## 2022-11-06 NOTE — OB Triage Note (Signed)
Patient states that she is concerned because she has had some off and on ctx's and she is pregnant with twins at this time. Pt denies any trauma, LOF, or bleeding at this time.

## 2022-11-07 DIAGNOSIS — Z3A31 31 weeks gestation of pregnancy: Secondary | ICD-10-CM | POA: Diagnosis not present

## 2022-11-07 DIAGNOSIS — O30033 Twin pregnancy, monochorionic/diamniotic, third trimester: Secondary | ICD-10-CM

## 2022-11-07 DIAGNOSIS — R109 Unspecified abdominal pain: Secondary | ICD-10-CM | POA: Diagnosis not present

## 2022-11-07 DIAGNOSIS — O26893 Other specified pregnancy related conditions, third trimester: Secondary | ICD-10-CM | POA: Diagnosis not present

## 2022-11-07 LAB — WET PREP, GENITAL
Clue Cells Wet Prep HPF POC: NONE SEEN
Sperm: NONE SEEN
Trich, Wet Prep: NONE SEEN
WBC, Wet Prep HPF POC: >50 — AB (ref <10)
Yeast Wet Prep HPF POC: NONE SEEN

## 2022-11-07 NOTE — OB Triage Note (Signed)
Discharge information reviewed with pt. Pt reminded to pick up RX when able. Reviewed labor precautions with pt and when to come to hospital. Pt verbalized understanding. Pt ambulated to exit well appearing.

## 2022-11-07 NOTE — Discharge Summary (Signed)
LABOR & DELIVERY OB TRIAGE NOTE  SUBJECTIVE  HPI Kristina Frost is a 22 y.o. G3P2002 at [redacted]w[redacted]d who presents to Labor & Delivery for several sharp abdominal pains through the day. Kristina Frost reports occasional contractions that are uncomfortable as well as sharp pains through the day. She received her care in [place], [place] & is here visiting family for her baby shower. She endorses fetal movement. Denies loss of fluid, vaginal bleeding, headache, vision changes or RUQ pain. She reports continued nausea/vomiting despite prescribed medications. She reports chest pain with the nausea & that she cannot lay flat due to discomfort and pressure up from her abdomen. This has been happening for weeks and is not worse tonight. She reports her cervix was 1cm dilated when she was seen on labor & delivery in [place] around 6w ago.  Pregnancy complicated by mo/di twin pregnancy, anemia, history of IUGR in prior pregnancy, history of gestational hypertension, history of syphilis.  OB History     Gravida  3   Para  2   Term  2   Preterm  0   AB  0   Living  2      SAB  0   IAB  0   Ectopic  0   Multiple  0   Live Births  2           Scheduled Meds:  sodium citrate-citric acid         OBJECTIVE  BP 116/65 (BP Location: Right Arm)   Pulse 95   Temp 98.3 F (36.8 C) (Oral)   Resp 18   Ht 5\' 4"  (1.626 m)   Wt 73.5 kg   LMP 03/30/2022   BMI 27.81 kg/m   General: A&Ox4, NAD Heart: regular rate Lungs: normal work of breathing Abdomen: gravid, soft, nontender Cervical exam: Dilation: 1 Effacement (%): 50 Cervical Position: Posterior Station: -3 Presentation: Vertex Exam by:: Kristina Frost, CNM  SSE: moderate amount thick white discharge present in vaginal vault, cervix posterior and not well visualized. No bleeding.  NST I reviewed the NST and it was reactive.  Baseline: 145 Variability: moderate Accelerations: present Decelerations:none  Baseline: 135 Variability:  moderate Accelerations: present Decelerations:none Toco: irritability, soft resting tone Category I   ASSESSMENT Impression  1) Pregnancy at G3P2002, [redacted]w[redacted]d, Estimated Date of Delivery: 01/04/23 2) Reassuring maternal/fetal status, no evidence of preterm labor 3) Mo/di twin gestation 4) nausea/vomiting of pregnancy 5) GERD  PLAN UA & wet prep collected. Wet prep negative for infection. Urine culture ordered & pending. Will notify of result via MyChart. Discharge home with preterm labor precautions. Follow up as scheduled with MFM & home OB clinic. Dominica Severin, CNM 11/07/22  12:01 AM

## 2022-11-08 LAB — URINE CULTURE
